# Patient Record
Sex: Female | Born: 1945
Health system: Southern US, Community
[De-identification: ages and names within clinical notes are randomized; demographics above are authoritative.]

## PROBLEM LIST (undated history)

## (undated) DIAGNOSIS — R7309 Other abnormal glucose: Secondary | ICD-10-CM

## (undated) DIAGNOSIS — M8580 Other specified disorders of bone density and structure, unspecified site: Secondary | ICD-10-CM

## (undated) DIAGNOSIS — R011 Cardiac murmur, unspecified: Secondary | ICD-10-CM

## (undated) DIAGNOSIS — E039 Hypothyroidism, unspecified: Secondary | ICD-10-CM

## (undated) DIAGNOSIS — E785 Hyperlipidemia, unspecified: Secondary | ICD-10-CM

## (undated) DIAGNOSIS — K219 Gastro-esophageal reflux disease without esophagitis: Secondary | ICD-10-CM

## (undated) DIAGNOSIS — F419 Anxiety disorder, unspecified: Secondary | ICD-10-CM

## (undated) HISTORY — DX: Hypothyroidism, unspecified: E03.9

## (undated) HISTORY — DX: Other specified disorders of bone density and structure, unspecified site: M85.80

## (undated) HISTORY — DX: Cardiac murmur, unspecified: R01.1

## (undated) HISTORY — DX: Anxiety disorder, unspecified: F41.9

## (undated) HISTORY — PX: OTHER SURGICAL HISTORY: SHX169

## (undated) HISTORY — DX: Gastro-esophageal reflux disease without esophagitis: K21.9

## (undated) HISTORY — DX: Other abnormal glucose: R73.09

## (undated) HISTORY — DX: Hyperlipidemia, unspecified: E78.5

---
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
## 1985-01-23 HISTORY — PX: DILATION AND CURETTAGE OF UTERUS: SHX78

## 1995-01-24 HISTORY — PX: EXTERNAL FIXATION ANKLE FRACTURE: SHX1548

## 1998-02-18 ENCOUNTER — Other Ambulatory Visit: Admission: RE | Admit: 1998-02-18 | Discharge: 1998-02-18 | Payer: Self-pay | Admitting: Obstetrics and Gynecology

## 1999-04-11 ENCOUNTER — Other Ambulatory Visit: Admission: RE | Admit: 1999-04-11 | Discharge: 1999-04-11 | Payer: Self-pay | Admitting: Obstetrics and Gynecology

## 2000-04-11 ENCOUNTER — Other Ambulatory Visit: Admission: RE | Admit: 2000-04-11 | Discharge: 2000-04-11 | Payer: Self-pay | Admitting: Obstetrics and Gynecology

## 2001-05-14 ENCOUNTER — Other Ambulatory Visit: Admission: RE | Admit: 2001-05-14 | Discharge: 2001-05-14 | Payer: Self-pay | Admitting: Obstetrics and Gynecology

## 2001-08-23 ENCOUNTER — Ambulatory Visit (HOSPITAL_COMMUNITY): Admission: RE | Admit: 2001-08-23 | Discharge: 2001-08-23 | Payer: Self-pay | Admitting: Internal Medicine

## 2001-08-23 ENCOUNTER — Encounter: Payer: Self-pay | Admitting: Internal Medicine

## 2002-06-03 ENCOUNTER — Other Ambulatory Visit: Admission: RE | Admit: 2002-06-03 | Discharge: 2002-06-03 | Payer: Self-pay | Admitting: Obstetrics and Gynecology

## 2002-11-20 ENCOUNTER — Encounter: Payer: Self-pay | Admitting: Internal Medicine

## 2004-02-04 ENCOUNTER — Ambulatory Visit: Payer: Self-pay | Admitting: Internal Medicine

## 2004-02-15 ENCOUNTER — Ambulatory Visit: Payer: Self-pay | Admitting: Internal Medicine

## 2004-07-05 ENCOUNTER — Other Ambulatory Visit: Admission: RE | Admit: 2004-07-05 | Discharge: 2004-07-05 | Payer: Self-pay | Admitting: Obstetrics and Gynecology

## 2005-01-12 ENCOUNTER — Ambulatory Visit: Payer: Self-pay | Admitting: Internal Medicine

## 2005-12-22 ENCOUNTER — Ambulatory Visit: Payer: Self-pay | Admitting: Internal Medicine

## 2006-03-08 ENCOUNTER — Ambulatory Visit: Payer: Self-pay | Admitting: Internal Medicine

## 2006-03-08 LAB — CONVERTED CEMR LAB
ALT: 16 units/L (ref 0–40)
AST: 19 units/L (ref 0–37)
Cholesterol: 257 mg/dL (ref 0–200)
Direct LDL: 170.7 mg/dL
HDL: 47.6 mg/dL (ref >39.0)
Hgb A1c MFr Bld: 5.6 % (ref 4.6–6.0)
TSH: 3.89 microintl units/mL (ref 0.35–5.50)
Total CHOL/HDL Ratio: 5.4
Triglycerides: 156 mg/dL — ABNORMAL HIGH (ref 0–149)
VLDL: 31 mg/dL (ref 0–40)

## 2006-07-31 ENCOUNTER — Ambulatory Visit: Payer: Self-pay | Admitting: Internal Medicine

## 2006-07-31 DIAGNOSIS — R739 Hyperglycemia, unspecified: Secondary | ICD-10-CM

## 2006-07-31 DIAGNOSIS — E039 Hypothyroidism, unspecified: Secondary | ICD-10-CM

## 2006-07-31 LAB — CONVERTED CEMR LAB
Cholesterol, target level: 200 mg/dL
HDL goal, serum: 40 mg/dL
LDL Goal: 160 mg/dL

## 2006-08-07 ENCOUNTER — Encounter (INDEPENDENT_AMBULATORY_CARE_PROVIDER_SITE_OTHER): Payer: Self-pay | Admitting: *Deleted

## 2006-08-07 ENCOUNTER — Encounter: Payer: Self-pay | Admitting: Internal Medicine

## 2006-08-07 LAB — CONVERTED CEMR LAB
ALT: 17 units/L (ref 0–35)
AST: 20 units/L (ref 0–37)
Cholesterol: 296 mg/dL (ref 0–200)
Direct LDL: 190.6 mg/dL
HDL: 46.1 mg/dL (ref >39.0)
Hgb A1c MFr Bld: 5.7 % (ref 4.6–6.0)
TSH: 3.51 microintl units/mL (ref 0.35–5.50)
Total CHOL/HDL Ratio: 6.4
Triglycerides: 213 mg/dL (ref 0–149)
VLDL: 43 mg/dL — ABNORMAL HIGH (ref 0–40)

## 2006-09-03 ENCOUNTER — Encounter: Payer: Self-pay | Admitting: Internal Medicine

## 2006-11-09 ENCOUNTER — Ambulatory Visit: Payer: Self-pay | Admitting: Internal Medicine

## 2006-11-18 LAB — CONVERTED CEMR LAB
ALT: 19 units/L (ref 0–35)
AST: 20 units/L (ref 0–37)
Cholesterol: 193 mg/dL (ref 0–200)
HDL: 53.7 mg/dL (ref >39.0)
LDL Cholesterol: 119 mg/dL — ABNORMAL HIGH (ref 0–99)
Total CHOL/HDL Ratio: 3.6
Triglycerides: 104 mg/dL (ref 0–149)
VLDL: 21 mg/dL (ref 0–40)

## 2006-11-20 ENCOUNTER — Encounter (INDEPENDENT_AMBULATORY_CARE_PROVIDER_SITE_OTHER): Payer: Self-pay | Admitting: *Deleted

## 2006-11-20 ENCOUNTER — Telehealth (INDEPENDENT_AMBULATORY_CARE_PROVIDER_SITE_OTHER): Payer: Self-pay | Admitting: *Deleted

## 2007-03-14 ENCOUNTER — Telehealth: Payer: Self-pay | Admitting: Internal Medicine

## 2007-03-27 ENCOUNTER — Ambulatory Visit: Payer: Self-pay | Admitting: Internal Medicine

## 2007-03-31 LAB — CONVERTED CEMR LAB
ALT: 22 units/L (ref 0–35)
AST: 22 units/L (ref 0–37)
Albumin: 3.7 g/dL (ref 3.5–5.2)
Alkaline Phosphatase: 55 units/L (ref 39–117)
BUN: 10 mg/dL (ref 6–23)
Bilirubin, Direct: 0.1 mg/dL (ref 0.0–0.3)
Cholesterol: 208 mg/dL (ref 0–200)
Creatinine, Ser: 0.9 mg/dL (ref 0.4–1.2)
Direct LDL: 129.1 mg/dL
HDL: 48.1 mg/dL (ref >39.0)
Potassium: 4.1 meq/L (ref 3.5–5.1)
TSH: 5.26 microintl units/mL (ref 0.35–5.50)
Total Bilirubin: 0.7 mg/dL (ref 0.3–1.2)
Total CHOL/HDL Ratio: 4.3
Total Protein: 6.6 g/dL (ref 6.0–8.3)
Triglycerides: 149 mg/dL (ref 0–149)
VLDL: 30 mg/dL (ref 0–40)

## 2007-04-01 ENCOUNTER — Encounter (INDEPENDENT_AMBULATORY_CARE_PROVIDER_SITE_OTHER): Payer: Self-pay | Admitting: *Deleted

## 2007-04-05 ENCOUNTER — Telehealth (INDEPENDENT_AMBULATORY_CARE_PROVIDER_SITE_OTHER): Payer: Self-pay | Admitting: *Deleted

## 2007-08-02 ENCOUNTER — Ambulatory Visit: Payer: Self-pay | Admitting: Internal Medicine

## 2007-08-02 DIAGNOSIS — M8580 Other specified disorders of bone density and structure, unspecified site: Secondary | ICD-10-CM

## 2007-08-02 DIAGNOSIS — S82899A Other fracture of unspecified lower leg, initial encounter for closed fracture: Secondary | ICD-10-CM | POA: Insufficient documentation

## 2007-08-02 DIAGNOSIS — Z9089 Acquired absence of other organs: Secondary | ICD-10-CM | POA: Insufficient documentation

## 2007-08-02 DIAGNOSIS — Z9889 Other specified postprocedural states: Secondary | ICD-10-CM

## 2007-08-02 DIAGNOSIS — E785 Hyperlipidemia, unspecified: Secondary | ICD-10-CM

## 2007-08-13 ENCOUNTER — Encounter (INDEPENDENT_AMBULATORY_CARE_PROVIDER_SITE_OTHER): Payer: Self-pay | Admitting: *Deleted

## 2007-08-13 ENCOUNTER — Telehealth (INDEPENDENT_AMBULATORY_CARE_PROVIDER_SITE_OTHER): Payer: Self-pay | Admitting: *Deleted

## 2008-03-04 ENCOUNTER — Telehealth (INDEPENDENT_AMBULATORY_CARE_PROVIDER_SITE_OTHER): Payer: Self-pay | Admitting: *Deleted

## 2008-07-31 ENCOUNTER — Ambulatory Visit: Payer: Self-pay | Admitting: Internal Medicine

## 2008-07-31 LAB — CONVERTED CEMR LAB
ALT: 16 units/L (ref 0–35)
AST: 20 units/L (ref 0–37)
Albumin: 3.8 g/dL (ref 3.5–5.2)
Alkaline Phosphatase: 77 units/L (ref 39–117)
BUN: 12 mg/dL (ref 6–23)
Basophils Absolute: 0 10*3/uL (ref 0.0–0.1)
Basophils Relative: 0.2 % (ref 0.0–3.0)
Bilirubin, Direct: 0.1 mg/dL (ref 0.0–0.3)
CO2: 32 meq/L (ref 19–32)
Calcium: 9.1 mg/dL (ref 8.4–10.5)
Chloride: 102 meq/L (ref 96–112)
Cholesterol: 285 mg/dL — ABNORMAL HIGH (ref 0–200)
Creatinine, Ser: 0.9 mg/dL (ref 0.4–1.2)
Direct LDL: 202.1 mg/dL
Eosinophils Absolute: 0.2 10*3/uL (ref 0.0–0.7)
Eosinophils Relative: 4.3 % (ref 0.0–5.0)
GFR calc non Af Amer: 67.12 mL/min (ref >60)
Glucose, Bld: 87 mg/dL (ref 70–99)
HCT: 39.6 % (ref 36.0–46.0)
HDL: 44.2 mg/dL (ref >39.00)
Hemoglobin: 13.7 g/dL (ref 12.0–15.0)
Hgb A1c MFr Bld: 5.7 % (ref 4.6–6.5)
Lymphocytes Relative: 29.2 % (ref 12.0–46.0)
Lymphs Abs: 1.1 10*3/uL (ref 0.7–4.0)
MCHC: 34.5 g/dL (ref 30.0–36.0)
MCV: 90.6 fL (ref 78.0–100.0)
Monocytes Absolute: 0.3 10*3/uL (ref 0.1–1.0)
Monocytes Relative: 7.9 % (ref 3.0–12.0)
Neutro Abs: 2.1 10*3/uL (ref 1.4–7.7)
Neutrophils Relative %: 58.4 % (ref 43.0–77.0)
Platelets: 214 10*3/uL (ref 150.0–400.0)
Potassium: 4 meq/L (ref 3.5–5.1)
RBC: 4.37 M/uL (ref 3.87–5.11)
RDW: 12.8 % (ref 11.5–14.6)
Sodium: 141 meq/L (ref 135–145)
TSH: 4.19 microintl units/mL (ref 0.35–5.50)
Total Bilirubin: 1.1 mg/dL (ref 0.3–1.2)
Total CHOL/HDL Ratio: 6
Total Protein: 7 g/dL (ref 6.0–8.3)
Triglycerides: 151 mg/dL — ABNORMAL HIGH (ref 0.0–149.0)
VLDL: 30.2 mg/dL (ref 0.0–40.0)
WBC: 3.7 10*3/uL — ABNORMAL LOW (ref 4.5–10.5)

## 2008-08-05 ENCOUNTER — Ambulatory Visit: Payer: Self-pay | Admitting: Internal Medicine

## 2008-10-28 ENCOUNTER — Ambulatory Visit: Payer: Self-pay | Admitting: Internal Medicine

## 2008-10-29 ENCOUNTER — Encounter (INDEPENDENT_AMBULATORY_CARE_PROVIDER_SITE_OTHER): Payer: Self-pay | Admitting: *Deleted

## 2008-10-29 LAB — CONVERTED CEMR LAB
Albumin: 3.7 g/dL (ref 3.5–5.2)
Alkaline Phosphatase: 66 units/L (ref 39–117)
Cholesterol: 162 mg/dL (ref 0–200)
HDL: 42.5 mg/dL (ref >39.00)
VLDL: 23.8 mg/dL (ref 0.0–40.0)

## 2008-11-16 ENCOUNTER — Ambulatory Visit: Payer: Self-pay | Admitting: Internal Medicine

## 2009-01-19 ENCOUNTER — Telehealth (INDEPENDENT_AMBULATORY_CARE_PROVIDER_SITE_OTHER): Payer: Self-pay | Admitting: *Deleted

## 2009-01-27 ENCOUNTER — Telehealth: Payer: Self-pay | Admitting: Internal Medicine

## 2009-03-05 ENCOUNTER — Ambulatory Visit: Payer: Self-pay | Admitting: Internal Medicine

## 2009-03-05 DIAGNOSIS — R51 Headache: Secondary | ICD-10-CM

## 2009-03-05 DIAGNOSIS — R519 Headache, unspecified: Secondary | ICD-10-CM | POA: Insufficient documentation

## 2009-10-27 ENCOUNTER — Ambulatory Visit: Payer: Self-pay | Admitting: Internal Medicine

## 2009-10-27 ENCOUNTER — Encounter: Payer: Self-pay | Admitting: Internal Medicine

## 2009-10-27 DIAGNOSIS — I471 Supraventricular tachycardia: Secondary | ICD-10-CM

## 2009-10-28 LAB — CONVERTED CEMR LAB
AST: 21 units/L (ref 0–37)
Albumin: 3.8 g/dL (ref 3.5–5.2)
Alkaline Phosphatase: 71 units/L (ref 39–117)
BUN: 12 mg/dL (ref 6–23)
Basophils Absolute: 0 10*3/uL (ref 0.0–0.1)
Bilirubin, Direct: 0.1 mg/dL (ref 0.0–0.3)
CO2: 31 meq/L (ref 19–32)
Calcium: 9.1 mg/dL (ref 8.4–10.5)
Cholesterol: 195 mg/dL (ref 0–200)
Creatinine, Ser: 0.7 mg/dL (ref 0.4–1.2)
Eosinophils Absolute: 0.2 10*3/uL (ref 0.0–0.7)
GFR calc non Af Amer: 86.5 mL/min (ref >60)
Glucose, Bld: 89 mg/dL (ref 70–99)
LDL Cholesterol: 127 mg/dL — ABNORMAL HIGH (ref 0–99)
Lymphocytes Relative: 27.3 % (ref 12.0–46.0)
MCHC: 33.8 g/dL (ref 30.0–36.0)
Monocytes Relative: 7.6 % (ref 3.0–12.0)
Neutro Abs: 2.6 10*3/uL (ref 1.4–7.7)
Neutrophils Relative %: 60.2 % (ref 43.0–77.0)
Platelets: 213 10*3/uL (ref 150.0–400.0)
RDW: 13.7 % (ref 11.5–14.6)
Total Bilirubin: 0.7 mg/dL (ref 0.3–1.2)
Total CHOL/HDL Ratio: 4

## 2010-01-25 ENCOUNTER — Ambulatory Visit
Admission: RE | Admit: 2010-01-25 | Discharge: 2010-01-25 | Payer: Self-pay | Source: Home / Self Care | Attending: Internal Medicine | Admitting: Internal Medicine

## 2010-01-25 DIAGNOSIS — J019 Acute sinusitis, unspecified: Secondary | ICD-10-CM | POA: Insufficient documentation

## 2010-02-09 ENCOUNTER — Telehealth: Payer: Self-pay | Admitting: Internal Medicine

## 2010-02-20 LAB — CONVERTED CEMR LAB
ALT: 24 units/L (ref 0–35)
AST: 24 units/L (ref 0–37)
Albumin: 3.9 g/dL (ref 3.5–5.2)
Alkaline Phosphatase: 64 units/L (ref 39–117)
BUN: 9 mg/dL (ref 6–23)
Basophils Absolute: 0 10*3/uL (ref 0.0–0.1)
Basophils Relative: 0.3 % (ref 0.0–1.0)
Bilirubin, Direct: 0.1 mg/dL (ref 0.0–0.3)
CO2: 32 meq/L (ref 19–32)
Calcium: 9.2 mg/dL (ref 8.4–10.5)
Chloride: 103 meq/L (ref 96–112)
Cholesterol, target level: 200 mg/dL
Cholesterol: 200 mg/dL (ref 0–200)
Creatinine, Ser: 0.8 mg/dL (ref 0.4–1.2)
Eosinophils Absolute: 0.1 10*3/uL (ref 0.0–0.7)
Eosinophils Relative: 3.4 % (ref 0.0–5.0)
GFR calc Af Amer: 93 mL/min
GFR calc non Af Amer: 77 mL/min
Glucose, Bld: 88 mg/dL (ref 70–99)
HCT: 40.4 % (ref 36.0–46.0)
HDL goal, serum: 50 mg/dL
HDL: 43.8 mg/dL (ref >39.0)
Hemoglobin: 13.6 g/dL (ref 12.0–15.0)
Hgb A1c MFr Bld: 5.9 % (ref 4.6–6.0)
LDL Cholesterol: 131 mg/dL — ABNORMAL HIGH (ref 0–99)
LDL Goal: 100 mg/dL
Lymphocytes Relative: 28.6 % (ref 12.0–46.0)
MCHC: 33.6 g/dL (ref 30.0–36.0)
MCV: 91.9 fL (ref 78.0–100.0)
Monocytes Absolute: 0.3 10*3/uL (ref 0.1–1.0)
Monocytes Relative: 7.2 % (ref 3.0–12.0)
Neutro Abs: 2.5 10*3/uL (ref 1.4–7.7)
Neutrophils Relative %: 60.5 % (ref 43.0–77.0)
Platelets: 217 10*3/uL (ref 150–400)
Potassium: 3.9 meq/L (ref 3.5–5.1)
RBC: 4.39 M/uL (ref 3.87–5.11)
RDW: 13 % (ref 11.5–14.6)
Sodium: 140 meq/L (ref 135–145)
TSH: 2.39 microintl units/mL (ref 0.35–5.50)
Total Bilirubin: 0.7 mg/dL (ref 0.3–1.2)
Total CHOL/HDL Ratio: 4.6
Total Protein: 6.7 g/dL (ref 6.0–8.3)
Triglycerides: 126 mg/dL (ref 0–149)
VLDL: 25 mg/dL (ref 0–40)
Vit D, 1,25-Dihydroxy: 55 (ref 30–89)
WBC: 4.1 10*3/uL — ABNORMAL LOW (ref 4.5–10.5)

## 2010-02-22 NOTE — Assessment & Plan Note (Signed)
Summary: CPX//KN   Vital Signs:  Patient profile:   65 year old female Height:      65 inches Weight:      136 pounds BMI:     22.71 Temp:     98.4 degrees F oral Pulse rate:   76 / minute Resp:     14 per minute BP sitting:   112 / 68  (left arm) Cuff size:   large  Vitals Entered By: Shonna Chock CMA (October 27, 2009 8:20 AM)  CC: Lipid Management Comments Patient refused flu vaccine, patient states " The sickest I've every been was after I received the flu Vaccine in the past."   CC:  Lipid Management.  History of Present Illness: Amy Tucker is here for a physical.The EKG  revealed 3 beat  run of   SVT ( PACs vs PNCs)  ; she is asymptomatic & denies excess stimulant intake. Hyperlipidemia Follow-Up       The patient denies muscle aches, GI upset, abdominal pain, flushing, itching, constipation, diarrhea, and fatigue.  The patient denies the following symptoms: chest pain/pressure, exercise intolerance, dypsnea, palpitations ( see EKG) , syncope, and pedal edema.  Compliance with medications (by patient report) has been near 100%.  Dietary compliance has been good.  FH of CAD; 1 M uncle pre 20.  Lipid Management History:      Positive NCEP/ATP III risk factors include female age 51 years old or older, early menopause without estrogen hormone replacement, and family history for ischemic heart disease (males less than 75 years old).  Negative NCEP/ATP III risk factors include non-diabetic, non-tobacco-user status, non-hypertensive, no ASHD (atherosclerotic heart disease), no prior stroke/TIA, no peripheral vascular disease, and no history of aortic aneurysm.     Current Medications (verified): 1)  Synthroid 75 Mcg  Tabs (Levothyroxine Sodium) .[name].. 1 By Mouth Once Daily Except 1& 1/2 On Weds 2)  Alprazolam 0.25 Mg  Tabs (Alprazolam) .... 1/2 Tab As Needed For Storms 3)  Vit C 4)  Vit E 5)  Multivitamin 6)  Calcium 7)  Vit D 8)  Simvastatin 40 Mg  Tabs (Simvastatin) .[name].. 1 At  Bedtime ; Start This After Present Supply of Pravastatin Completed; Fasting Labs 10 Weeks Later 9)  Maxalt-Mlt 10 Mg Tbdp (Rizatriptan Benzoate) .[name].. 1 As Needed Headache  Allergies: 1)  ! Meloxicam (Meloxicam) 2)  ! Tramadol Hcl (Tramadol Hcl)  Past History:  Past Medical History: OSTEOPENIA (ICD-733.90), BMD as per  Dr  Richarda Overlie, Gyn HYPERLIPIDEMIA (ICD-272.4):NMR Lipoprofile 2004: LDL 169( 2322/1419), TG 130. LDL goal = < 100. Framingham Study LDL goal = < 130. HYPOTHYROIDISM (ICD-244.9) FASTING HYPERGLYCEMIA 790.29  Past Surgical History: FRACTURE, ANKLE (ICD-824.8) post fall 1997 DILATION AND CURETTAGE, PMH  OF (ICD-V45.89) X3 TONSILLECTOMY AND ADENOIDECTOMY, HX OF (ICD-V45.79) G 1 P1 ; Colonoscopy: none to date ("I don't want to be put to sleep") SOC reviewed.  Family History: Family History Thyroid disease-Aunt MI-4 Uncles , 1 pre 50 y.o. DM-M-Aunts & M uncles Father: leukemia Mother: DM,HTN,CAD,MI after 68 ,CVA,RA, renal failure Siblings: bro: HTN  Social History: Low fat,carb Occupation:Auditor  Former Smoker: quit 1992. She smoked from ages  59 to 75 up to 2 ppd Alcohol use-yes: rarely Regular exercise-no  Review of Systems  The patient denies anorexia, fever, vision loss, decreased hearing, hoarseness, prolonged cough, headaches, hemoptysis, melena, hematochezia, severe indigestion/heartburn, hematuria, suspicious skin lesions, depression, unusual weight change, abnormal bleeding, enlarged lymph nodes, and angioedema.  Weight down 3#. No dysphagia; occasional burning SS with certain foods such as [place] foods.  Physical Exam  General:  well-nourished; alert,appropriate and cooperative throughout examination Head:  Normocephalic and atraumatic without obvious abnormalities.  Eyes:  No corneal or conjunctival inflammation noted.  Perrla. Funduscopic exam benign, without hemorrhages, exudates or papilledema. Ears:  External ear exam shows no  significant lesions or deformities.  Otoscopic examination reveals clear canals, tympanic membranes are intact bilaterally without bulging, retraction, inflammation or discharge. Hearing is grossly normal bilaterally. Nose:  External nasal examination shows no deformity or inflammation. Nasal mucosa are pink and moist without lesions or exudates. Mouth:  Oral mucosa and oropharynx without lesions or exudates.  Upper partial Neck:  No deformities, masses, or tenderness noted. Lungs:  Normal respiratory effort, chest expands symmetrically. Lungs are clear to auscultation, no crackles or wheezes. Heart:  Normal rate and regular rhythm. S1 and S2 normal without gallop, click, rub .S4 . Grade 1/2 LSB murmur Abdomen:  Bowel sounds positive,abdomen soft and non-tender without masses, organomegaly or hernias noted. Rectal:  Stool cards done @ Gyn Genitalia:  Dr Marcelle Overlie Msk:  No deformity or scoliosis noted of thoracic or lumbar spine.   Pulses:  R and L carotid,radial,dorsalis pedis and posterior tibial pulses are full and equal bilaterally Extremities:  No clubbing, cyanosis, edema. Minor OA DIP deformities  noted with normal full range of motion of all joints.   Neurologic:  alert & oriented X3 and DTRs symmetrical and normal.   Skin:  Intact without suspicious lesions or rashes Cervical Nodes:  No lymphadenopathy noted Axillary Nodes:  No palpable lymphadenopathy Psych:  memory intact for recent and remote, normally interactive, and good eye contact.     Impression & Recommendations:  Problem # 1:  ROUTINE GENERAL MEDICAL EXAM@[email or web address]  CARE FACL (ICD-V70.0)  Orders: EKG w/ Interpretation (93000) Venipuncture (43329) TLB-Lipid Panel (80061-LIPID) TLB-BMP (Basic Metabolic Panel-BMET) (80048-METABOL) TLB-CBC Platelet - w/Differential (85025-CBCD) TLB-Hepatic/Liver Function Pnl (80076-HEPATIC) TLB-TSH (Thyroid Stimulating Hormone) (84443-TSH) T-Vitamin D (25-Hydroxy) (51884-16606)  Problem # 2:   HYPERLIPIDEMIA (ICD-272.4)  Her updated medication list for this problem includes:    Simvastatin 40 Mg Tabs (Simvastatin) .[name]... 1 at bedtime ; start this after present supply of pravastatin completed; fasting labs 10 weeks later  Problem # 3:  HYPOTHYROIDISM (ICD-244.9)  Her updated medication list for this problem includes:    Synthroid 75 Mcg Tabs (Levothyroxine sodium) .[name]... 1 by mouth once daily except 1& 1/2 on weds ( note : she is taking 1 once daily )  Problem # 4:  OSTEOPENIA (ICD-733.90)  Problem # 5:  PAROXYSMAL SUPRAVENTRICULAR TACHYCARDIA (ICD-427.0) 3 beat, asymptomatic  Complete Medication List: 1)  Synthroid 75 Mcg Tabs (Levothyroxine sodium) .[name].. 1 by mouth once daily except 1& 1/2 on weds ( note : she is taking 1 once daily ) 2)  Alprazolam 0.25 Mg Tabs (Alprazolam) .... 1/2 tab as needed for storms 3)  Vit C  4)  Vit E  5)  Multivitamin  6)  Calcium  7)  Vit D  8)  Simvastatin 40 Mg Tabs (Simvastatin) .[name].. 1 at bedtime ; start this after present supply of pravastatin completed; fasting labs 10 weeks later  Lipid Assessment/Plan:      Based on NCEP/ATP III, the patient's risk factor category is "2 or more risk factors and a calculated 10 year CAD risk of < 20%".  The patient's lipid goals are as follows: Total cholesterol goal is 200; LDL cholesterol goal is 100; HDL  cholesterol goal is 50; Triglyceride goal is 150.  Her LDL cholesterol goal has been met.  Secondary causes for hyperlipidemia have been ruled out.  She has been counseled on adjunctive measures for lowering her cholesterol and has been provided with dietary instructions.    Patient Instructions: 1)  Avoid excess stimulants  as discussed; report palpitations. 2)  It is important that you exercise regularly at least 20 minutes 5 times a week. If you develop chest pain, have severe difficulty breathing, or feel very tired , stop exercising immediately and seek medical attention. 3)  Take an  81 mg coaqted  Aspirin every day with b'fast. 4)  Take 650-1000mg  of Tylenol every 4-6 hours as needed for relief of pain or comfort of fever AVOID taking more than 4000mg   in a 24 hour period (can cause liver damage in higher doses). Prescriptions: SIMVASTATIN 40 MG  TABS (SIMVASTATIN) 1 at bedtime ; start this after present supply of pravastatin completed; fasting labs 10 weeks later  #90 x 3   Entered and Authorized by:   Marga Melnick MD   Signed by:   Marga Melnick MD on 10/27/2009   Method used:   Print then Give to Patient   RxID:   [id number] ALPRAZOLAM 0.25 MG  TABS (ALPRAZOLAM) 1/2 tab as needed for storms  #60 x 3   Entered and Authorized by:   Marga Melnick MD   Signed by:   Marga Melnick MD on 10/27/2009   Method used:   Print then Give to Patient   RxID:   [id number] SYNTHROID 75 MCG  TABS (LEVOTHYROXINE SODIUM) 1 by mouth once daily EXCEPT 1& 1/2 on Weds ( Note : she is taking 1 once daily )  #90 x 3   Entered and Authorized by:   Marga Melnick MD   Signed by:   Marga Melnick MD on 10/27/2009   Method used:   Print then Give to Patient   RxID:   [id number]     Appended Document: CPX//KN

## 2010-02-22 NOTE — Assessment & Plan Note (Signed)
Summary: Medication Concerns (Mobic not helping)/scm   Vital Signs:  Patient profile:   65 year old female Weight:      143 pounds Temp:     98.3 degrees F oral Pulse rate:   80 / minute Resp:     15 per minute BP sitting:   124 / 78  (left arm) Cuff size:   large  Vitals Entered By: Shonna Chock (March 05, 2009 3:00 PM) CC: Medication concerns, Revisit increasing Synthroid, Headaches Comments REVIEWED MED LIST, PATIENT AGREED DOSE AND INSTRUCTION CORRECT    CC:  Medication concerns, Revisit increasing Synthroid, and Headaches.  History of Present Illness: Replacements for Darvocet ineffective ; she was dizzy with tramadol & Meloxicam "hurt stomach". No rash or fever  with these drugs. The headaches have not varied for > 20-25 yrs.The patient reports photophobia, but denies nausea, vomiting, sweats, tearing of eyes, nasal congestion, sinus pain, sinus pressure, and phonophobia.  The headache is described as intermittent and throbbing.  The location of the pain is usually unilateral on the left OR  unilateral on the right OR rarely in midline  The patient denies the following high-risk features: fever, neck pain/stiffness, vision loss or change, focal weakness, altered mental status, rash, trauma, pain worse with exertion, and new type of headache.  The headaches are precipitated by odors (antique stores & smoke) and change in weather.  No diagnosis of migraines. No prodrome or aura.  Allergies (verified): 1)  ! Meloxicam (Meloxicam) 2)  ! Tramadol Hcl (Tramadol Hcl)  Review of Systems General:  Complains of fatigue; TSH was 4.19 in 07/2008. ENT:  Complains of decreased hearing; denies ringing in ears. Neuro:  Denies brief paralysis, numbness, tingling, and weakness. Endo:  Complains of cold intolerance.  Physical Exam  General:  Appearsyounger than age; alert,appropriate and cooperative throughout examination Eyes:  No corneal or conjunctival inflammation noted. EOMI. Perrla.  Field of Vision grossly normal. Ears:  External ear exam shows no significant lesions or deformities.  Otoscopic examination reveals clear canals, tympanic membranes are intact bilaterally without bulging, retraction, inflammation or discharge. Hearing is grossly normal bilaterally. Mouth:  Oral mucosa and oropharynx without lesions or exudates.  Upper partial Heart:  Normal rate and regular rhythm. S1 and S2 normal without gallop, murmur, click, rub or other extra sounds. Pulses:  R and L carotid pulses are full and equal bilaterally w/o bruits Neurologic:  alert & oriented X3, cranial nerves II-XII intact, strength normal in all extremities, sensation intact to light touch, gait normal, DTRs symmetrical and normal, finger-to-nose normal, and Romberg negative.   Skin:  Intact without suspicious lesions or rashes Psych:  memory intact for recent and remote, normally interactive, and good eye contact.     Impression & Recommendations:  Problem # 1:  HEADACHE (ICD-784.0) Previously responsive to Darvocet; intolerance to Tramadol & Meloxicam The following medications were removed from the medication list:    Tramadol Hcl 50 Mg Tabs (Tramadol hcl) .[name]... 1 by mouth every 6 hours as needed    Meloxicam 7.5 Mg Tabs (Meloxicam) .[name]... 1 two times a day as needed pain Her updated medication list for this problem includes:    Maxalt-mlt 10 Mg Tbdp (Rizatriptan benzoate) .[name]... 1 as needed headache  Problem # 2:  HYPOTHYROIDISM (ICD-244.9)  Her updated medication list for this problem includes:    Synthroid 75 Mcg Tabs (Levothyroxine sodium) .[name]... 1 by mouth once daily except 1& 1/2 on weds  Complete Medication List: 1)  Synthroid 75  Mcg Tabs (Levothyroxine sodium) .[name].. 1 by mouth once daily except 1& 1/2 on weds 2)  Alprazolam 0.25 Mg Tabs (Alprazolam) .... 1/2 tab as needed for storms 3)  Vit C  4)  Vit E  5)  Multivitamin  6)  Calcium  7)  Vit D  8)  Simvastatin 40 Mg Tabs (Simvastatin) .[name].. 1  at bedtime ; start this after present supply of pravastatin completed; fasting labs 10 weeks later 9)  Maxalt-mlt 10 Mg Tbdp (Rizatriptan benzoate) .[name].. 1 as needed headache  Patient Instructions: 1)  Incerase Synthroid to 1 once daily except 1&1/2 every Weds; TSH after 4 months.(244.9). Keep Headache Diary as discussed Prescriptions: MAXALT-MLT 10 MG TBDP (RIZATRIPTAN BENZOATE) 1 as needed headache  #6 x 5   Entered and Authorized by:   Marga Melnick MD   Signed by:   Marga Melnick MD on 03/05/2009   Method used:   Print then Give to Patient   RxID:   [id number]

## 2010-02-22 NOTE — Progress Notes (Signed)
Summary: Tramadol Not Working  Phone Note Call from Patient Call back at [phone]   Caller: Patient Summary of Call: Patient would like Tramadol switched to something else, med makes her nauseated and it not helping headaches  Dr.Martita Brumm please further advise and forward to Triage Nurse  [facility]  [facility]  January 27, 2009 11:32 AM   Follow-up for Phone Call        see Mobic Rx Follow-up by: Marga Melnick MD,  January 27, 2009 4:18 PM  Additional Follow-up for Phone Call Additional follow up Details #1::        pt aware................Marland KitchenFelecia Deloach CMA  January 27, 2009 4:52 PM     New/Updated Medications: MELOXICAM 7.5 MG TABS (MELOXICAM) 1 two times a day as needed pain Prescriptions: MELOXICAM 7.5 MG TABS (MELOXICAM) 1 two times a day as needed pain  #20 x 0   Entered and Authorized by:   Marga Melnick MD   Signed by:   Jeremy Johann CMA on 01/27/2009   Method used:   Faxed to ...       [facility]       [address]       [place], [place]  [address]       Ph: [phone]       Fax: [phone]   RxID:   [id number]

## 2010-02-24 NOTE — Progress Notes (Signed)
Summary: Cough/RX  Phone Note Call from Patient Call back at Work Phone [phone]   Summary of Call: Patient called noting that her cough went away but has returned and she still has some drainage. All other symptoms have resolved.  She would like to know if she needs another round of ABX. Please advise. Initial call taken by: Lucious Groves CMA,  February 09, 2010 12:10 PM  Follow-up for Phone Call        see Rx Follow-up by: Marga Melnick MD,  February 09, 2010 12:56 PM  Additional Follow-up for Phone Call Additional follow up Details #1::        Patient notified. Additional Follow-up by: Lucious Groves CMA,  February 09, 2010 2:15 PM    New/Updated Medications: AZITHROMYCIN 250 MG TABS (AZITHROMYCIN) as per pack Prescriptions: AZITHROMYCIN 250 MG TABS (AZITHROMYCIN) as per pack  #1 x 0   Entered and Authorized by:   Marga Melnick MD   Signed by:   Marga Melnick MD on 02/09/2010   Method used:   Electronically to        [facility] (retail)       [address]       [place], [place]  [address]       Ph: [phone]       Fax: [phone]   RxID:   [id number]

## 2010-02-24 NOTE — Assessment & Plan Note (Signed)
Summary: POSSIBLE URI/KB   Vital Signs:  Patient profile:   65 year old female Weight:      136.6 pounds BMI:     22.81 Temp:     98.2 degrees F oral Pulse rate:   72 / minute Resp:     15 per minute BP sitting:   122 / 80  (left arm) Cuff size:   large  Vitals Entered By: Shonna Chock CMA (January 25, 2010 4:24 PM) CC: URI symptoms since last Wed   CC:  URI symptoms since last Wed.  History of Present Illness:      This is a 65 year old woman who presents with  RTI symptoms; onset 1227 as rhinitis  followed by fever (resolved 12/31 ) , N&V and diarrhea (resolved 12/29).  The patient reports  alternating nasal congestion,scant  purulent nasal discharge, productive cough, and   L earache.  The patient  now denies fever, dyspnea, wheezing, vomiting, and diarrhea.  The patient also reports L frontal  headache.  Risk factors for Strep sinusitis include L  unilateral facial pain.  The patient denies the following risk factors for Strep sinusitis: tooth pain and tender adenopathy.  Rx: vit C, Echinacea , Robitussin, Airborne, & Tylenol.  Current Medications (verified): 1)  Synthroid 75 Mcg  Tabs (Levothyroxine Sodium) .[name].. 1 By Mouth Once Daily Except 1& 1/2 On Weds ( Note : She Is Taking 1 Once Daily ) 2)  Alprazolam 0.25 Mg  Tabs (Alprazolam) .... 1/2 Tab As Needed For Storms 3)  Vit C 4)  Vit E 5)  Multivitamin 6)  Calcium 7)  Vit D 8)  Simvastatin 40 Mg  Tabs (Simvastatin) .[name].. 1 At Bedtime ; Start This After Present Supply of Pravastatin Completed; Fasting Labs 10 Weeks Later  Allergies: 1)  ! Meloxicam (Meloxicam) 2)  ! Tramadol Hcl (Tramadol Hcl)  Physical Exam  General:  in no acute distress; alert,appropriate and cooperative throughout examination Ears:  External ear exam shows no significant lesions or deformities.  Otoscopic examination reveals clear canals, tympanic membranes are intact bilaterally without bulging, retraction, inflammation or discharge. Hearing is  grossly normal bilaterally. Nose:  External nasal examination shows no deformity or inflammation. Nasal mucosa are  dry without lesions or exudates. Mouth:  Oral mucosa and oropharynx without lesions or exudates.  Upper partial Lungs:  Normal respiratory effort, chest expands symmetrically. Lungs are clear to auscultation, no crackles or wheezes. Heart:  Normal rate and regular rhythm. S1 and S2 normal without gallop, murmur, click, rub or other extra sounds. S4 Cervical Nodes:  No lymphadenopathy noted Axillary Nodes:  No palpable lymphadenopathy   Impression & Recommendations:  Problem # 1:  SINUSITIS- ACUTE-NOS (ICD-461.9)  Her updated medication list for this problem includes:    Amoxicillin 500 Mg Caps (Amoxicillin) .[name]... 1 three times a day    Benzonatate 200 Mg Caps (Benzonatate) .[name]... 1 every 6-8 hrs as needed for cough  Problem # 2:  BRONCHITIS-ACUTE (ICD-466.0)  Her updated medication list for this problem includes:    Amoxicillin 500 Mg Caps (Amoxicillin) .[name]... 1 three times a day    Benzonatate 200 Mg Caps (Benzonatate) .[name]... 1 every 6-8 hrs as needed for cough  Complete Medication List: 1)  Synthroid 75 Mcg Tabs (Levothyroxine sodium) .[name].. 1 by mouth once daily except 1& 1/2 on weds ( note : she is taking 1 once daily ) 2)  Alprazolam 0.25 Mg Tabs (Alprazolam) .... 1/2 tab as needed for storms  3)  Vit C  4)  Vit E  5)  Multivitamin  6)  Calcium  7)  Vit D  8)  Simvastatin 40 Mg Tabs (Simvastatin) .[name].. 1 at bedtime ; start this after present supply of pravastatin completed; fasting labs 10 weeks later 9)  Amoxicillin 500 Mg Caps (Amoxicillin) .[name].. 1 three times a day 10)  Benzonatate 200 Mg Caps (Benzonatate) .[name].. 1 every 6-8 hrs as needed for cough  Patient Instructions: 1)  Neti pot once daily - two times a day as needed for head congestion. 2)  Drink as much NON dairy  fluid as you can tolerate for the next few days. Prescriptions: BENZONATATE 200 MG CAPS  (BENZONATATE) 1 every 6-8 hrs as needed for cough  #15 x 0   Entered and Authorized by:   Marga Melnick MD   Signed by:   Marga Melnick MD on 01/25/2010   Method used:   Electronically to        [facility] [address] (retail)       [phone]       [place], [place]  [address]       Ph: [phone]       Fax: [phone]   RxID:   [id number] AMOXICILLIN 500 MG CAPS (AMOXICILLIN) 1 three times a day  #30 x 0   Entered and Authorized by:   Marga Melnick MD   Signed by:   Marga Melnick MD on 01/25/2010   Method used:   Electronically to        [facility] [address] (retail)       [phone]       [place], [place]  [address]       Ph: [phone]       Fax: [phone]   RxID:   [id number]    Orders Added: 1)  Est. Patient Level III [83151]

## 2010-03-09 ENCOUNTER — Telehealth: Payer: Self-pay | Admitting: Internal Medicine

## 2010-03-16 NOTE — Progress Notes (Signed)
Summary: med question/congestion  Phone Note Call from Patient Call back at Work Phone [phone]   Summary of Call: Patient left message on triage asking if it is ok for her to take Tylenol Severe Cold Multisympton. Please advise. Initial call taken by: Lucious Groves CMA,  March 09, 2010 9:16 AM  Follow-up for Phone Call        not if it contains Pseudoephedrine (decongestant) Follow-up by: Marga Melnick MD,  March 09, 2010 12:53 PM  Additional Follow-up for Phone Call Additional follow up Details #1::        Patient notified of the above and would like to know what she can take. She notes that her previous symptoms have returned and appear to be worse on  one side. Please advise. Lucious Groves CMA  March 09, 2010 2:20 PM      Additional Follow-up for Phone Call Additional follow up Details #2::    she neds OV; she continues to have symptoms despite 2 courses of antibiotics(she may need CXray & PFTs). can she come in this afternoon? Follow-up by: Marga Melnick MD,  March 09, 2010 3:27 PM  Additional Follow-up for Phone Call Additional follow up Details #3:: Details for Additional Follow-up Action Taken: Patient notes that she does not have cough or fever and she thinks that this is a cold. Patient declined office visit stating that she would "tough it out". She does think she needs any of the above. Lucious Groves CMA  March 09, 2010 3:41 PM

## 2010-06-08 ENCOUNTER — Encounter: Payer: Self-pay | Admitting: Internal Medicine

## 2010-06-08 ENCOUNTER — Ambulatory Visit (INDEPENDENT_AMBULATORY_CARE_PROVIDER_SITE_OTHER): Payer: Medicare PPO | Admitting: Internal Medicine

## 2010-06-08 VITALS — BP 114/68 | HR 64 | Temp 99.1°F | Wt 138.2 lb

## 2010-06-08 DIAGNOSIS — R21 Rash and other nonspecific skin eruption: Secondary | ICD-10-CM

## 2010-06-08 DIAGNOSIS — L509 Urticaria, unspecified: Secondary | ICD-10-CM

## 2010-06-08 MED ORDER — HYDROXYZINE PAMOATE 25 MG PO CAPS: 25.0000 mg | ORAL_CAPSULE | Freq: Three times a day (TID) | ORAL | Status: DC | PRN

## 2010-06-08 MED ORDER — RANITIDINE HCL 150 MG PO TABS: 150.0000 mg | ORAL_TABLET | Freq: Two times a day (BID) | ORAL | Status: DC

## 2010-06-08 MED ORDER — PREDNISONE 20 MG PO TABS: 20.0000 mg | ORAL_TABLET | Freq: Two times a day (BID) | ORAL | Status: AC

## 2010-06-08 NOTE — Patient Instructions (Signed)
Go to [facility] MD for Urticaria or hives

## 2010-06-08 NOTE — Progress Notes (Signed)
  Subjective:    Patient ID: Amy Tucker, female    DOB: [birth year], 65 y.o.   MRN: [id number]  HPI RASH  Location: both arms ; LUE > RUE Onset: 1 month ago upon awakening 04/16  Course: slightly improved but persistent Self-treated with: cortisone cream & topical area             Improvement with treatment: partially in reference to pruritis  History Pruritis: yes  Tenderness: no  New medications/antibiotics: no  Tick/insect/pet exposure: no, but tick found on dog night prior to rash  Recent travel: no  New detergent, new clothing, or other topical exposure: no   Red Flags Feeling ill: no  Fever: no  Mouth lesions: yes; 2 months ago Facial/tongue swelling/difficulty breathing:  no  Diabetic or immunocompromised:  no      Review of Systems     Objective:   Physical Exam Oral exam: Dental hygiene is good; lips and gums are healthy appearing.There is no oropharyngeal erythema or exudate noted. Skin:  Classic vascular lesions of various size LUE ; all blanch with pressure.Dermatographia  demonstrable  Lymphatic: No lymphadenopathy is noted about the head, neck, axilla, or inguinal areas.         Assessment & Plan:  #1 urticarial vascular type lesions which blanch with pressure  Plan: Both H2 and H1 blockers will be employed along with a short course of prednisone. She'll be referred to [facility] M.D. concerning urticaria.

## 2010-07-28 ENCOUNTER — Other Ambulatory Visit: Payer: Self-pay | Admitting: Internal Medicine

## 2010-10-19 ENCOUNTER — Encounter: Payer: Self-pay | Admitting: Internal Medicine

## 2010-10-20 ENCOUNTER — Ambulatory Visit (INDEPENDENT_AMBULATORY_CARE_PROVIDER_SITE_OTHER): Payer: Medicare PPO | Admitting: Internal Medicine

## 2010-10-20 ENCOUNTER — Encounter: Payer: Self-pay | Admitting: Internal Medicine

## 2010-10-20 DIAGNOSIS — F419 Anxiety disorder, unspecified: Secondary | ICD-10-CM

## 2010-10-20 DIAGNOSIS — R7989 Other specified abnormal findings of blood chemistry: Secondary | ICD-10-CM

## 2010-10-20 DIAGNOSIS — E785 Hyperlipidemia, unspecified: Secondary | ICD-10-CM

## 2010-10-20 DIAGNOSIS — E039 Hypothyroidism, unspecified: Secondary | ICD-10-CM

## 2010-10-20 DIAGNOSIS — R51 Headache: Secondary | ICD-10-CM

## 2010-10-20 DIAGNOSIS — Z0000 Encounter for general adult medical examination without abnormal findings: Secondary | ICD-10-CM

## 2010-10-20 DIAGNOSIS — M949 Disorder of cartilage, unspecified: Secondary | ICD-10-CM

## 2010-10-20 DIAGNOSIS — M899 Disorder of bone, unspecified: Secondary | ICD-10-CM

## 2010-10-20 LAB — CBC WITH DIFFERENTIAL/PLATELET
Basophils Relative: 0.7 % (ref 0.0–3.0)
Eosinophils Absolute: 0.2 10*3/uL (ref 0.0–0.7)
Eosinophils Relative: 4.1 % (ref 0.0–5.0)
Lymphocytes Relative: 21.8 % (ref 12.0–46.0)
MCHC: 32.4 g/dL (ref 30.0–36.0)
Neutrophils Relative %: 67.1 % (ref 43.0–77.0)
RBC: 4.44 Mil/uL (ref 3.87–5.11)
WBC: 4.9 10*3/uL (ref 4.5–10.5)

## 2010-10-20 MED ORDER — SIMVASTATIN 40 MG PO TABS: 40.0000 mg | ORAL_TABLET | Freq: Every day | ORAL | Status: DC

## 2010-10-20 MED ORDER — LEVOTHYROXINE SODIUM 75 MCG PO TABS: 75.0000 ug | ORAL_TABLET | ORAL | Status: DC

## 2010-10-20 MED ORDER — ALPRAZOLAM 0.25 MG PO TABS: ORAL_TABLET | ORAL | Status: DC

## 2010-10-20 NOTE — Progress Notes (Signed)
Subjective:    Patient ID: Amy Tucker, female    DOB: August 25, 1945, 65 y.o.   MRN: [id number]  HPI Medicare Wellness Visit:  The following psychosocial & medical history were reviewed as required by Medicare.   Social history: caffeine: 3/4 cup coffee , alcohol:  rarely ,  tobacco use : quit 1992  & exercise : walking 7X/ week 30-60 min.   Home & personal  safety / fall risk: no issues, activities of daily living: no limitations , seatbelt use : yes , and smoke alarm employment : yes .  Power of Attorney/Living Will status : NO (discussed)  Vision ( as recorded per Nurse) & Hearing  evaluation :  Last Ophth exam 2010; wall chart read @ 6 ft with lenses. Decreased acuity to whisper @ 6 ft. Orientation :oriented  X3 , memory & recall :good, spelling or math testing: good ,and mood & affect : normal . Depression / anxiety: no Travel history : never , immunization status :NO ( risks discussed) , transfusion history:  no, and preventive health surveillance ( colonoscopies, BMD , etc as per protocol/ SOC): colonoscopy :"NEVER";"I don't want to be put to sleep" [facility] discussed), Dental care:  Every 6 months . Chart reviewed &  Updated. Active issues reviewed & addressed.       Review of Systems HYPERLIPIDEMIA: Chest pain, palpitations- gas ? Vs  Palpitations ?, non exertional       Dyspnea- no Medications: Compliance- yes  Lightheadedness,Syncope- no    Edema- no Abd pain, bowel changes- no   Muscle aches- no but some arthralgias  FASTING HYPERGLYCEMIA: Disease Monitoring: Blood Sugar ranges-no Polyuria/phagia/dipsia- no       Visual problems- no( Ophth exam overdue) Medications: Compliance- no meds Diet:low fat, low carb  FH: 9 M uncles & aunts & MGM had DM                Objective:   Physical Exam Gen.: Healthy and well-nourished in appearance. Alert, appropriate and cooperative throughout exam.Appears younger than stated age Head: Normocephalic without obvious  abnormalities Eyes: No corneal or conjunctival inflammation noted. Pupils equal round reactive to light and accommodation. Fundal exam is benign without hemorrhages, exudate, papilledema. Extraocular motion intact.Ptosis OD > OS Ears: External  ear exam reveals no significant lesions or deformities. Canals clear .TMs normal.  Nose: External nasal exam reveals no deformity or inflammation. Nasal mucosa are pink and moist. No lesions or exudates noted. Mouth: Oral mucosa and oropharynx reveal no lesions or exudates. Teeth in good repair. Upper partial. Osteoma of palate Neck: No deformities, masses, or tenderness noted. Range of motion &. Thyroid  Full w/o nodules. Lungs: Normal respiratory effort; chest expands symmetrically. Lungs are clear to auscultation without rales, wheezes, or increased work of breathing. Heart: Normal rate and rhythm. Normal S1 and S2. No gallop, click, or rub. Soft S4 w/o  murmur. Abdomen: Bowel sounds normal; abdomen soft and nontender. No masses, organomegaly or hernias noted. Genitalia: Gyn needed   .                                                                                   Musculoskeletal/extremities: No  deformity or scoliosis noted of  the thoracic or lumbar spine; but  Slight asymmetry of paraspinus muscles. No clubbing, cyanosis, edema noted. Range of motion  Normal; SLR to > 90 degrees .Tone & strength  Normal.Joints:DIP OA changes. Nail health  good. Vascular: Carotid, radial artery, dorsalis pedis and  posterior tibial pulses are full and equal. No bruits present. Neurologic: Alert and oriented x3. Deep tendon reflexes symmetrical and normal.          Skin: Intact without suspicious lesions or rashes. Lymph: No cervical, axillary, or inguinal lymphadenopathy present. Psych: Mood and affect are normal. Normally interactive                                                                                        Assessment & Plan:  #1 Medicare Wellness  Exam; criteria met ; data entered #2 Problem List reviewed ; Assessment/ Recommendations made Plan: see Orders

## 2010-10-20 NOTE — Patient Instructions (Signed)
Preventive Health Care: Exercise  30-45  minutes a day, 3-4 days a week. Walking is especially valuable in preventing Osteoporosis. Eat a low-fat diet with lots of fruits and vegetables, up to 7-9 servings per day.Consume less than 30 grams of sugar per day from foods & drinks with High Fructose Corn Syrup as # 1,2,3 or #4 on label. Eye Doctor - have an eye exam @ least annually Health Care Power of Attorney & Living Will place you in charge of your health care  decisions. Verify these are  in place.  As per the Standard of Care , screening Colonoscopy recommended @ 50 & every 5-10 years thereafter . More frequent monitor would be dictated by family history or findings @ Colonoscopy

## 2010-10-21 LAB — HEPATIC FUNCTION PANEL
AST: 24 U/L (ref 0–37)
Albumin: 4.1 g/dL (ref 3.5–5.2)
Total Protein: 7 g/dL (ref 6.0–8.3)

## 2010-10-21 LAB — LIPID PANEL
Cholesterol: 209 mg/dL — ABNORMAL HIGH (ref 0–200)
HDL: 49.6 mg/dL (ref >39.00)
Triglycerides: 165 mg/dL — ABNORMAL HIGH (ref 0.0–149.0)

## 2010-10-21 LAB — LDL CHOLESTEROL, DIRECT: Direct LDL: 127.3 mg/dL

## 2010-10-21 LAB — TSH: TSH: 6.81 u[IU]/mL — ABNORMAL HIGH (ref 0.35–5.50)

## 2010-10-21 LAB — BASIC METABOLIC PANEL
Calcium: 8.6 mg/dL (ref 8.4–10.5)
Creatinine, Ser: 0.8 mg/dL (ref 0.4–1.2)

## 2010-10-21 LAB — VITAMIN D 25 HYDROXY (VIT D DEFICIENCY, FRACTURES): Vit D, 25-Hydroxy: 36 ng/mL (ref 30–89)

## 2010-10-27 ENCOUNTER — Telehealth: Payer: Self-pay

## 2010-10-27 MED ORDER — LEVOTHYROXINE SODIUM 75 MCG PO TABS: 75.0000 ug | ORAL_TABLET | ORAL | Status: DC

## 2010-10-27 NOTE — Telephone Encounter (Signed)
New rx sent to patient, med list updated. Labs mailed

## 2010-10-27 NOTE — Telephone Encounter (Signed)
Message copied by Edgardo Roys on Thu Oct 27, 2010 11:39 AM ------      Message from: Pecola Lawless      Created: Tue Oct 25, 2010  6:08 PM       The normal goal for  Vitamin D is 40-60. Vitamin D, along with calcium( 600 mg twice a day) & weight bearing exercises ( @ least 30 minutes of walking @ least 3X/ week),  is essential for bone health. Vitamin D is the # 1 cause of muscle pain in women. Add 1000 IU vitamin D3 one  Pill  3X/ week  to present daily dose. Recheck vit D level in 4-6  months (268.9).       The most common cause of elevated triglycerides  (TG)  is the ingestion of sugar from high fructose corn syrup sources. You should consume less than 30 grams  of sugar per day from foods and drinks with high fructose corn syrup as number 1, 2, 3, or #4 on the label. As TG go up, HDL or good cholesterol goes down. Also uric acid which causes gout will go up.      TSH (Thyroid Stimulating Hormone) normal range = 0.35- 5.50. Ideal value is 1-3. A  Value below 0.35 indicates excessive thyroid supplementation (HYPERthyroid state) & a Value > 5.50 indicates inadequate replacement.(HYPOthyroid state) Either extreme can have adverse long  term effects. Please increase thyroid supplement to 75 mcg daily EXCEPT 1 & 1/2 on Weds.Repeat TSH after 10  Weeks (244.9)        All other labs are excellent.[employer]

## 2011-01-25 ENCOUNTER — Other Ambulatory Visit: Payer: Self-pay | Admitting: Internal Medicine

## 2011-04-25 ENCOUNTER — Other Ambulatory Visit: Payer: Self-pay

## 2011-04-25 NOTE — Telephone Encounter (Signed)
Message left on triage voicemail: Patient states she is almost out of her medication and she called the pharmacy Last Wed and they stated they are waiting to hear from [place]. Please call back at [phone] or [phone]   I called patient back and left message informing her she is due for a TSH 244.9 level to be checked. We can schedule patient to have this done as early as today. Medication can be filled as soon as TSH level is back. Patient instructed to call back to discuss

## 2011-04-26 ENCOUNTER — Other Ambulatory Visit (INDEPENDENT_AMBULATORY_CARE_PROVIDER_SITE_OTHER): Payer: Medicare PPO

## 2011-04-26 DIAGNOSIS — E039 Hypothyroidism, unspecified: Secondary | ICD-10-CM

## 2011-04-26 LAB — TSH: TSH: 3.12 u[IU]/mL (ref 0.35–5.50)

## 2011-04-28 ENCOUNTER — Other Ambulatory Visit: Payer: Self-pay | Admitting: Internal Medicine

## 2011-04-28 MED ORDER — LEVOTHYROXINE SODIUM 75 MCG PO TABS: ORAL_TABLET | ORAL | Status: DC

## 2011-04-28 NOTE — Telephone Encounter (Signed)
Refill for  Synthroid Tab Qty 99 Take 1-tablet by mouth everyday EXCEPT on Wednesdays Take 1-1/2-tablets Last filled 1.4.13

## 2011-04-28 NOTE — Telephone Encounter (Signed)
RX sent

## 2011-05-02 NOTE — Telephone Encounter (Signed)
Patient had labs, rx was sent

## 2011-10-02 ENCOUNTER — Telehealth: Payer: Self-pay | Admitting: Internal Medicine

## 2011-10-02 DIAGNOSIS — R21 Rash and other nonspecific skin eruption: Secondary | ICD-10-CM

## 2011-10-02 NOTE — Telephone Encounter (Signed)
Refill: Ranitidine 150mg  tab. Take one tablet by mouth twice daily. Qty 60. Last fill 06-08-10 Prednisone 20mg  tab. Take one tablet by mouth twice daily. Qty 14. Last fill 06-08-10 Hydroxyz pam 25mg  cap. Take one capsule by mouth three times daily as needed for itching. Qty 30. Last fill 06-08-10

## 2011-10-03 MED ORDER — HYDROXYZINE PAMOATE 25 MG PO CAPS: 25.0000 mg | ORAL_CAPSULE | Freq: Three times a day (TID) | ORAL | Status: DC | PRN

## 2011-10-03 NOTE — Telephone Encounter (Signed)
I spoke with patient , patient stated that she was transferring from one [facility] to the other and did not need refills on Any medications. Patient stated she is no longer taking Ranitidine and did not need prednisone. This was a mix-up.

## 2011-10-09 ENCOUNTER — Other Ambulatory Visit: Payer: Self-pay | Admitting: Internal Medicine

## 2011-10-09 MED ORDER — RANITIDINE HCL 150 MG PO CAPS: 150.0000 mg | ORAL_CAPSULE | Freq: Two times a day (BID) | ORAL | Status: DC

## 2011-10-09 MED ORDER — PREDNISONE 10 MG PO TABS: 10.0000 mg | ORAL_TABLET | Freq: Every day | ORAL | Status: DC

## 2011-10-09 NOTE — Telephone Encounter (Signed)
Ranitidine 150 mg one twice a day dispense 60. Prednisone 10 (TEN , not twenty) mg twice a day with food, dispense 14. Would need office visit for additional refills.

## 2011-10-09 NOTE — Telephone Encounter (Signed)
Dr.Hopper please advise on patient's request for prednisone and ranitidine (not on med list)

## 2011-10-09 NOTE — Telephone Encounter (Signed)
RXs sent.

## 2011-10-09 NOTE — Telephone Encounter (Signed)
prednisone 20 mg Qty 14  Take one tablet by mouth twice daily Last filled 06/08/10   and ranitidine 150 mg  Qty 60 Take one tablet by mouth twice daily Last filled 06/08/10  Faxed number [phone]

## 2011-10-23 ENCOUNTER — Encounter: Payer: Self-pay | Admitting: Internal Medicine

## 2011-10-23 ENCOUNTER — Ambulatory Visit (INDEPENDENT_AMBULATORY_CARE_PROVIDER_SITE_OTHER): Payer: Medicare PPO | Admitting: Internal Medicine

## 2011-10-23 VITALS — BP 118/74 | HR 60 | Temp 97.8°F | Resp 12 | Ht 65.03 in | Wt 135.8 lb

## 2011-10-23 DIAGNOSIS — E039 Hypothyroidism, unspecified: Secondary | ICD-10-CM

## 2011-10-23 DIAGNOSIS — T887XXA Unspecified adverse effect of drug or medicament, initial encounter: Secondary | ICD-10-CM

## 2011-10-23 DIAGNOSIS — F419 Anxiety disorder, unspecified: Secondary | ICD-10-CM

## 2011-10-23 DIAGNOSIS — F411 Generalized anxiety disorder: Secondary | ICD-10-CM

## 2011-10-23 DIAGNOSIS — M899 Disorder of bone, unspecified: Secondary | ICD-10-CM

## 2011-10-23 DIAGNOSIS — Z0000 Encounter for general adult medical examination without abnormal findings: Secondary | ICD-10-CM

## 2011-10-23 DIAGNOSIS — R7989 Other specified abnormal findings of blood chemistry: Secondary | ICD-10-CM

## 2011-10-23 DIAGNOSIS — M949 Disorder of cartilage, unspecified: Secondary | ICD-10-CM

## 2011-10-23 DIAGNOSIS — E785 Hyperlipidemia, unspecified: Secondary | ICD-10-CM

## 2011-10-23 DIAGNOSIS — K219 Gastro-esophageal reflux disease without esophagitis: Secondary | ICD-10-CM

## 2011-10-23 LAB — BASIC METABOLIC PANEL
CO2: 28 mEq/L (ref 19–32)
Chloride: 102 mEq/L (ref 96–112)
Creatinine, Ser: 0.7 mg/dL (ref 0.4–1.2)
Glucose, Bld: 91 mg/dL (ref 70–99)

## 2011-10-23 LAB — HEPATIC FUNCTION PANEL
ALT: 16 U/L (ref 0–35)
Bilirubin, Direct: 0 mg/dL (ref 0.0–0.3)
Total Protein: 7.2 g/dL (ref 6.0–8.3)

## 2011-10-23 LAB — CBC WITH DIFFERENTIAL/PLATELET
Basophils Absolute: 0.1 10*3/uL (ref 0.0–0.1)
Eosinophils Absolute: 0.1 10*3/uL (ref 0.0–0.7)
Lymphocytes Relative: 20.8 % (ref 12.0–46.0)
MCHC: 32.5 g/dL (ref 30.0–36.0)
Monocytes Relative: 5.9 % (ref 3.0–12.0)
Neutrophils Relative %: 69.5 % (ref 43.0–77.0)
RDW: 13.7 % (ref 11.5–14.6)

## 2011-10-23 LAB — LDL CHOLESTEROL, DIRECT: Direct LDL: 209.7 mg/dL

## 2011-10-23 LAB — LIPID PANEL
Cholesterol: 304 mg/dL — ABNORMAL HIGH (ref 0–200)
Total CHOL/HDL Ratio: 7

## 2011-10-23 LAB — TSH: TSH: 4.47 u[IU]/mL (ref 0.35–5.50)

## 2011-10-23 MED ORDER — MELOXICAM 7.5 MG PO TABS: 7.5000 mg | ORAL_TABLET | Freq: Every day | ORAL | Status: DC

## 2011-10-23 MED ORDER — ALPRAZOLAM 0.25 MG PO TABS: ORAL_TABLET | ORAL | Status: DC

## 2011-10-23 NOTE — Progress Notes (Signed)
Subjective:    Patient ID: Amy Tucker, female    DOB: 08-10-1945, 66 y.o.   MRN: [id number]  HPI Medicare Wellness Visit:  The following psychosocial & medical history were reviewed as required by Medicare.   Social history: caffeine: 1.5 cups coffee/ day , alcohol:  Very rarely,  tobacco use : quit 1992  & exercise : walking 25 min > 5 X / week.   Home & personal  safety / fall risk: no issues, activities of daily living: no limitations , seatbelt use : yes , and smoke alarm employment : yes .  Power of Attorney/Living Will status :NO  Vision ( as recorded per Nurse) & Hearing  evaluation :  Ophth 2012; no hearing exam. Orientation :oriented X 3 , memory & recall :good,  math testing: good,and mood & affect : normal . Depression / anxiety: denied Travel history : never , immunization status : Shingles , PNA, Flu needed (all declined today) , transfusion history:  no, and preventive health surveillance ( colonoscopies, BMD , etc as per protocol/ SOC): NEVER had colonoscopy; "I don't want to be put to sleep" ( SOC reviewed), Dental care:  Every 12 mos . Chart reviewed &  Updated. Active issues reviewed & addressed.       Review of Systems HYPERLIPIDEMIA: Chest pain, palpitations- no       Dyspnea- no Lightheadedness,Syncope- no    Edema- no Medications: Compliance- statin D/Ced 1/13   FASTING HYPERGLYCEMIA, PMH of:  Disease Monitoring: Blood Sugar ranges-no Polyuria/phagia/dipsia- no      Visual problems- no   Abd pain, bowel changes- no   Muscle aches- minor off statin post activity      Objective:   Physical Exam Gen.:  well-nourished in appearance. Alert, appropriate and cooperative throughout exam. Head: Normocephalic without obvious abnormalities Eyes: No corneal or conjunctival inflammation noted.  Extraocular motion intact. Vision grossly normal with lenses. Ears: External  ear exam reveals no significant lesions or deformities. Canals clear .TMs normal. Hearing is  grossly normal bilaterally. Nose: External nasal exam reveals no deformity or inflammation. Nasal mucosa are pink and moist. No lesions or exudates noted.   Mouth: Oral mucosa and oropharynx reveal no lesions or exudates. Teeth in good repair.Osteoma of hard palate Neck: No deformities, masses, or tenderness noted. Range of motion & Thyroid normal. Lungs: Normal respiratory effort; chest expands symmetrically. Lungs are clear to auscultation without rales, wheezes, or increased work of breathing. Heart: Normal rate and rhythm. Normal S1 and S2. No gallop, click, or rub. S4 w/o murmur. Abdomen: Bowel sounds normal; abdomen soft and nontender. No masses, organomegaly or hernias noted. Genitalia: as per Gyn                                                                    Musculoskeletal/extremities: Minor lordosis noted of  the thoracic  spine. No clubbing, cyanosis, edema, or significant deformity noted. Range of motion  normal .Tone & strength  Normal.Joints:minor DIP DJD  changes. Nail health  good. Vascular: Carotid, radial artery, dorsalis pedis and  posterior tibial pulses are full and equal. No bruits present. Neurologic: Alert and oriented x3. Deep tendon reflexes symmetrical and normal.          Skin: Intact  without suspicious lesions or rashes. Lymph: No cervical, axillary lymphadenopathy present. Psych: Mood and affect are normal. Normally interactive                                                                                      Assessment & Plan:  #1 Medicare Wellness Exam; criteria met ; data entered #2 Problem List reviewed ; Assessment/ Recommendations made Plan: see Orders

## 2011-10-23 NOTE — Addendum Note (Signed)
Addended by: Silvio Pate D on: 10/23/2011 02:39 PM   Modules accepted: Orders

## 2011-10-23 NOTE — Patient Instructions (Addendum)
Preventive Health Care: Exercise  30-45  minutes a day, 3-4 days a week. Walking is especially valuable in preventing Osteoporosis. Eat a low-fat diet with lots of fruits and vegetables, up to 7-9 servings per day. Consume less than 30 grams of sugar per day from foods & drinks with High Fructose Corn Syrup as #1,2,3 or #4 on label. Please take enteric-coated aspirin 81 mg daily with breakfast.  Health Care Power of Attorney & Living Will place you in charge of your health care  decisions. Verify these are  in place. As per the Standard of Care , screening Colonoscopy recommended @ 50 & every 5-10 years thereafter . More frequent monitor would be dictated by family history or findings @ Colonoscopy. Please send complete cards  If you activate My Chart; the results can be released to you as soon as they populate from the lab. If you choose not to use this program; the labs have to be reviewed, copied & mailed   causing a delay in getting the results to you.

## 2011-10-27 LAB — VITAMIN D 1,25 DIHYDROXY
Vitamin D 1, 25 (OH)2 Total: 64 pg/mL (ref 18–72)
Vitamin D2 1, 25 (OH)2: <8 pg/mL
Vitamin D3 1, 25 (OH)2: 64 pg/mL

## 2011-10-31 ENCOUNTER — Encounter: Payer: Self-pay | Admitting: Internal Medicine

## 2011-10-31 ENCOUNTER — Other Ambulatory Visit: Payer: Self-pay

## 2011-10-31 MED ORDER — LEVOTHYROXINE SODIUM 75 MCG PO TABS: ORAL_TABLET | ORAL | Status: DC

## 2011-11-23 ENCOUNTER — Ambulatory Visit (INDEPENDENT_AMBULATORY_CARE_PROVIDER_SITE_OTHER): Payer: Medicare PPO | Admitting: Internal Medicine

## 2011-11-23 ENCOUNTER — Encounter: Payer: Self-pay | Admitting: Internal Medicine

## 2011-11-23 VITALS — BP 112/68 | HR 71 | Wt 136.6 lb

## 2011-11-23 DIAGNOSIS — E785 Hyperlipidemia, unspecified: Secondary | ICD-10-CM

## 2011-11-23 MED ORDER — PRAVASTATIN SODIUM 40 MG PO TABS: 40.0000 mg | ORAL_TABLET | Freq: Every day | ORAL | Status: DC

## 2011-11-23 NOTE — Assessment & Plan Note (Signed)
Pravastatin 40 mg @ bedtime ; fasting labs 10 weeks

## 2011-11-23 NOTE — Patient Instructions (Addendum)
Please  schedule fasting Labs in 10 weeks : CK,Lipids, hepatic panel.PLEASE BRING THESE INSTRUCTIONS TO FOLLOW UP  LAB APPOINTMENT.This will guarantee correct labs are drawn, eliminating need for repeat blood sampling ( needle sticks ! ). Diagnoses /Codes: 272.4,995.20. 

## 2011-11-23 NOTE — Progress Notes (Signed)
  Subjective:    Patient ID: Amy Tucker, female    DOB: 27-Oct-1945, 66 y.o.   MRN: [id number]  HPI Dyslipidemia assessment: Prior Advanced Lipid Testing: NMR LDL goal = < 100, ideally < 70.   Family history of premature CAD/ MI: extremely strong .  Nutrition: heart healthy .  Exercise: walking 20-30 min 5X/ week & yardwork . Diabetes : FBS 91 . HTN: no. Smoking history  : quit 1992 .   Weight :  stable.  Lab results reviewed : LDL 209.7     Review of Systems ROS: fatigue: no ; chest pain :no ;claudication: no but leg fatigue on statin; palpitations: no; abd pain/bowel changes: no ; myalgias:only on statin;  syncope : no ; memory loss: no;skin changes: no.     Objective:   Physical Exam She appears healthy and well-nourished; she is in no acute distress  No carotid bruits are present.  Heart rhythm and rate are normal with no significant murmurs or gallops.  Chest is clear with no increased work of breathing  There is no evidence of aortic aneurysm or renal artery bruits  She has no clubbing or edema.   Pedal pulses are intact   No ischemic skin changes are present         Assessment & Plan:

## 2012-02-01 ENCOUNTER — Other Ambulatory Visit (INDEPENDENT_AMBULATORY_CARE_PROVIDER_SITE_OTHER): Payer: Medicare PPO

## 2012-02-01 DIAGNOSIS — T887XXA Unspecified adverse effect of drug or medicament, initial encounter: Secondary | ICD-10-CM

## 2012-02-01 DIAGNOSIS — E785 Hyperlipidemia, unspecified: Secondary | ICD-10-CM

## 2012-02-01 LAB — LIPID PANEL
Cholesterol: 153 mg/dL (ref 0–200)
LDL Cholesterol: 70 mg/dL (ref 0–99)
VLDL: 20.4 mg/dL (ref 0.0–40.0)

## 2012-02-01 LAB — HEPATIC FUNCTION PANEL
Bilirubin, Direct: 0.1 mg/dL (ref 0.0–0.3)
Total Protein: 7 g/dL (ref 6.0–8.3)

## 2012-02-12 ENCOUNTER — Other Ambulatory Visit: Payer: Self-pay | Admitting: Internal Medicine

## 2012-02-14 ENCOUNTER — Encounter: Payer: Self-pay | Admitting: Internal Medicine

## 2012-05-28 ENCOUNTER — Other Ambulatory Visit: Payer: Self-pay | Admitting: Internal Medicine

## 2012-05-28 DIAGNOSIS — F419 Anxiety disorder, unspecified: Secondary | ICD-10-CM

## 2012-05-28 MED ORDER — ALPRAZOLAM 0.25 MG PO TABS: ORAL_TABLET | ORAL | Status: DC

## 2012-05-28 NOTE — Telephone Encounter (Signed)
Alprazolam refill request. Pt last seen on 11/23/11, med last filled on 10/23/11 #30 with 0 refills.   Per Pharmacy pravastatin has refills available.

## 2012-05-28 NOTE — Telephone Encounter (Signed)
Last seen 11/23/11 and filled 10/23/11 #30. Please advise     KP

## 2012-05-28 NOTE — Telephone Encounter (Signed)
#  90 pravastatin refill x2  Generic Xanax #30 refill x2

## 2012-05-29 ENCOUNTER — Other Ambulatory Visit: Payer: Self-pay | Admitting: Internal Medicine

## 2012-05-29 NOTE — Telephone Encounter (Signed)
Rx faxed on 05/29/12. JLT

## 2012-05-29 NOTE — Telephone Encounter (Signed)
Xanax rx faxed on 05/29/12.

## 2012-05-29 NOTE — Telephone Encounter (Signed)
Med filled on 05/29/12.

## 2012-07-02 ENCOUNTER — Ambulatory Visit (INDEPENDENT_AMBULATORY_CARE_PROVIDER_SITE_OTHER): Payer: Medicare PPO | Admitting: Internal Medicine

## 2012-07-02 ENCOUNTER — Encounter: Payer: Self-pay | Admitting: Internal Medicine

## 2012-07-02 VITALS — BP 122/80 | HR 83 | Temp 98.5°F | Wt 133.0 lb

## 2012-07-02 DIAGNOSIS — R202 Paresthesia of skin: Secondary | ICD-10-CM

## 2012-07-02 DIAGNOSIS — T887XXA Unspecified adverse effect of drug or medicament, initial encounter: Secondary | ICD-10-CM

## 2012-07-02 DIAGNOSIS — R195 Other fecal abnormalities: Secondary | ICD-10-CM

## 2012-07-02 DIAGNOSIS — R200 Anesthesia of skin: Secondary | ICD-10-CM

## 2012-07-02 DIAGNOSIS — R450 Nervousness: Secondary | ICD-10-CM

## 2012-07-02 DIAGNOSIS — F411 Generalized anxiety disorder: Secondary | ICD-10-CM

## 2012-07-02 DIAGNOSIS — R209 Unspecified disturbances of skin sensation: Secondary | ICD-10-CM

## 2012-07-02 NOTE — Patient Instructions (Addendum)
Reflux of gastric acid may be asymptomatic as this may occur mainly during sleep.The triggers for reflux  include stress; the "aspirin family" ; alcohol; peppermint; and caffeine (coffee, tea, cola, and chocolate). The aspirin family would include aspirin and the nonsteroidal agents such as ibuprofen &  Naproxen. Tylenol would not cause reflux. If having symptoms ; food & drink should be avoided for @ least 2 hours before going to bed. Hold Pravastatin, Zantac, and Pepto-Bismol. Use omeprazole 20 mg samples 30 minutes before breakfast and before the evening meal. Please complete and return stool cards; these will determine whether there is any gastrointestinal bleeding risk. Alprazolam every 8-12 hours as needed for anxiety

## 2012-07-02 NOTE — Progress Notes (Signed)
Subjective:    Patient ID: Amy Tucker, female    DOB: 08-29-1945, 67 y.o.   MRN: [id number]  HPI Last week  6/3 or 6/5 she had an episode of numbness and tingling in the left upper extremity from the shoulder to the hand which occurred at rest. It left a heavy sensation in the extremity.  On 6/6 she experienced nausea unrelated to initial symptoms; this has continued until today.  The numbness and tingling in the left upper extremity recurred 6/8 again at rest.  She states that she feels nervous as if she were having a panic attack. She also describes some tarry stool 6/8. She has been taking Pepto-Bismol and Zantac; the Pepto-Bismol did help the nausea when taken 6/7 & 6/9.  PMH of peptic ulcer      Review of Systems   She had similar  LUE symptoms 2 years ago which resolved when she stopped simvastatin.     Objective:   Physical Exam Gen.: Thin but healthy and well-nourished in appearance. Alert, appropriate and cooperative throughout exam. Head: Normocephalic without obvious abnormalities Eyes: No corneal or conjunctival inflammation noted. No icterus Nose: External nasal exam reveals no deformity or inflammation. Nasal mucosa are pink and moist. No lesions or exudates noted.   Mouth: Oral mucosa and oropharynx reveal no lesions or exudates. Teeth in good repair.Upper partial Neck: No deformities, masses, or tenderness noted. Range of motion normal. Lungs: Normal respiratory effort; chest expands symmetrically. Lungs are clear to auscultation without rales, wheezes, or increased work of breathing. Heart: Normal rate and rhythm. Normal S1 and S2. No gallop, click, or rub. S4 w/o murmur. Abdomen: Bowel sounds normal; abdomen soft and nontender. No masses, organomegaly or hernias noted.                                  Musculoskeletal/extremities: No deformity or scoliosis noted of  the thoracic or lumbar spine.  No clubbing, cyanosis, edema, or significant extremity  deformity  noted. Range of motion normal .Tone & strength  Normal. Joints reveal minor  DJD DIP changes. Nail health good. Able to lie down & sit up w/o help.  Vascular: Carotid, radial artery, dorsalis pedis and  posterior tibial pulses are full and equal. No bruits present. Neurologic: Alert and oriented x3. Deep tendon reflexes symmetrical and normal.         Skin: Intact without suspicious lesions or rashes.No tenting Lymph: No cervical, axillary lymphadenopathy present. Psych: Mood and affect are normal. Normally interactive                                                                                        Assessment & Plan:  #1 jitteriness  #2 nausea; probable reflux. Past medical history peptic ulcer  #3 dark stool in the context of taking Pepto-Bismol  #4 intermittent numbness and tingling and heavy sensation in the left upper extremity  Plan: See orders and recommendations  She broke down into tears  admitting that she is distraught because her aged dog has advanced medical problems is not likely to live.

## 2012-07-03 LAB — CBC WITH DIFFERENTIAL/PLATELET
Basophils Absolute: 0 10*3/uL (ref 0.0–0.1)
Basophils Relative: 0.3 % (ref 0.0–3.0)
Eosinophils Absolute: 0.1 10*3/uL (ref 0.0–0.7)
Lymphocytes Relative: 23.5 % (ref 12.0–46.0)
MCHC: 33.6 g/dL (ref 30.0–36.0)
MCV: 91.3 fl (ref 78.0–100.0)
Monocytes Absolute: 0.4 10*3/uL (ref 0.1–1.0)
Neutro Abs: 3.5 10*3/uL (ref 1.4–7.7)
Neutrophils Relative %: 66.9 % (ref 43.0–77.0)
RBC: 4.59 Mil/uL (ref 3.87–5.11)
RDW: 13.7 % (ref 11.5–14.6)

## 2012-07-03 LAB — T4, FREE: Free T4: 0.99 ng/dL (ref 0.60–1.60)

## 2012-07-03 LAB — ALT: ALT: 16 U/L (ref 0–35)

## 2012-07-03 LAB — BASIC METABOLIC PANEL
BUN: 9 mg/dL (ref 6–23)
Chloride: 104 mEq/L (ref 96–112)
Potassium: 3.9 mEq/L (ref 3.5–5.1)

## 2012-07-03 LAB — CK: Total CK: 51 U/L (ref 7–177)

## 2012-07-03 LAB — MAGNESIUM: Magnesium: 2.2 mg/dL (ref 1.5–2.5)

## 2012-07-03 LAB — TSH: TSH: 3.84 u[IU]/mL (ref 0.35–5.50)

## 2012-07-08 ENCOUNTER — Encounter: Payer: Self-pay | Admitting: Internal Medicine

## 2012-07-12 ENCOUNTER — Ambulatory Visit (HOSPITAL_BASED_OUTPATIENT_CLINIC_OR_DEPARTMENT_OTHER)
Admission: RE | Admit: 2012-07-12 | Discharge: 2012-07-12 | Disposition: A | Discharge status: Home / Self Care | Payer: Medicare PPO | Source: Ambulatory Visit | Attending: Family Medicine | Admitting: Family Medicine

## 2012-07-12 ENCOUNTER — Other Ambulatory Visit: Payer: Self-pay

## 2012-07-12 ENCOUNTER — Encounter: Payer: Self-pay | Admitting: Family Medicine

## 2012-07-12 ENCOUNTER — Ambulatory Visit (INDEPENDENT_AMBULATORY_CARE_PROVIDER_SITE_OTHER): Payer: Medicare PPO | Admitting: Family Medicine

## 2012-07-12 VITALS — BP 122/82 | HR 70 | Temp 98.1°F | Wt 133.0 lb

## 2012-07-12 DIAGNOSIS — N390 Urinary tract infection, site not specified: Secondary | ICD-10-CM

## 2012-07-12 DIAGNOSIS — R109 Unspecified abdominal pain: Secondary | ICD-10-CM

## 2012-07-12 DIAGNOSIS — M549 Dorsalgia, unspecified: Secondary | ICD-10-CM

## 2012-07-12 LAB — BASIC METABOLIC PANEL
BUN: 10 mg/dL (ref 6–23)
CO2: 29 mEq/L (ref 19–32)
Calcium: 9.4 mg/dL (ref 8.4–10.5)
Creatinine, Ser: 0.8 mg/dL (ref 0.4–1.2)
Glucose, Bld: 130 mg/dL — ABNORMAL HIGH (ref 70–99)
Sodium: 136 mEq/L (ref 135–145)

## 2012-07-12 LAB — CBC WITH DIFFERENTIAL/PLATELET
Basophils Relative: 0.3 % (ref 0.0–3.0)
Eosinophils Relative: 1.2 % (ref 0.0–5.0)
HCT: 43.7 % (ref 36.0–46.0)
Hemoglobin: 14.3 g/dL (ref 12.0–15.0)
Lymphs Abs: 1 10*3/uL (ref 0.7–4.0)
MCV: 92.7 fl (ref 78.0–100.0)
Monocytes Absolute: 0.4 10*3/uL (ref 0.1–1.0)
Monocytes Relative: 5.6 % (ref 3.0–12.0)
Neutro Abs: 5.1 10*3/uL (ref 1.4–7.7)
WBC: 6.6 10*3/uL (ref 4.5–10.5)

## 2012-07-12 LAB — HEPATIC FUNCTION PANEL
AST: 21 U/L (ref 0–37)
Bilirubin, Direct: 0 mg/dL (ref 0.0–0.3)
Total Bilirubin: 0.8 mg/dL (ref 0.3–1.2)

## 2012-07-12 LAB — POCT URINALYSIS DIPSTICK
Glucose, UA: NEGATIVE
Spec Grav, UA: <=1.005
pH, UA: 7.5

## 2012-07-12 LAB — AMYLASE: Amylase: 79 U/L (ref 27–131)

## 2012-07-12 MED ORDER — TRAMADOL HCL 50 MG PO TABS: 50.0000 mg | ORAL_TABLET | Freq: Four times a day (QID) | ORAL | Status: DC | PRN

## 2012-07-12 MED ORDER — GI COCKTAIL ~~LOC~~
30.0000 mL | Freq: Once | ORAL | Status: AC
  Administered 2012-07-12: 30 mL via ORAL

## 2012-07-12 MED ORDER — CIPROFLOXACIN HCL 500 MG PO TABS: 500.0000 mg | ORAL_TABLET | Freq: Two times a day (BID) | ORAL | Status: DC

## 2012-07-12 NOTE — Patient Instructions (Addendum)
Abdominal Pain Abdominal pain can be caused by many things. Your caregiver decides the seriousness of your pain by an examination and possibly blood tests and X-rays. Many cases can be observed and treated at home. Most abdominal pain is not caused by a disease and will probably improve without treatment. However, in many cases, more time must pass before a clear cause of the pain can be found. Before that point, it may not be known if you need more testing, or if hospitalization or surgery is needed. HOME CARE INSTRUCTIONS   Do not take laxatives unless directed by your caregiver.  Take pain medicine only as directed by your caregiver.  Only take over-the-counter or prescription medicines for pain, discomfort, or fever as directed by your caregiver.  Try a clear liquid diet (broth, tea, or water) for as long as directed by your caregiver. Slowly move to a bland diet as tolerated. SEEK IMMEDIATE MEDICAL CARE IF:   The pain does not go away.  You have a fever.  You keep throwing up (vomiting).  The pain is felt only in portions of the abdomen. Pain in the right side could possibly be appendicitis. In an adult, pain in the left lower portion of the abdomen could be colitis or diverticulitis.  You pass bloody or black tarry stools. MAKE SURE YOU:   Understand these instructions.  Will watch your condition.  Will get help right away if you are not doing well or get worse. Document Released: 10/19/2004 Document Revised: 04/03/2011 Document Reviewed: 08/28/2007 [facility] Patient Information 2014 [place], [place].  Follow up as needed

## 2012-07-12 NOTE — Progress Notes (Signed)
  Subjective:     Amy Tucker is a 67 y.o. female who presents for evaluation of abdominal pain. Onset was a few days ago. Symptoms have been gradually worsening. The pain is described as burning and cramping,.. Pain is located in the LUQ with radiation to left back.  Aggravating factors: eating.  Alleviating factors: not eating. Associated symptoms: nausea. The patient denies arthralagias, belching, chills, constipation, diarrhea, dysuria, fever, flatus, frequency, headache, hematochezia, hematuria, melena, myalgias, sweats and vomiting.  The patient's history has been marked as reviewed and updated as appropriate.  Review of Systems Pertinent items are noted in HPI.     Objective:    BP 122/82  Pulse 70  Temp(Src) 98.1 F (36.7 C) (Oral)  Wt 133 lb (60.328 kg)  BMI 22.11 kg/m2  SpO2 98% General appearance: alert, cooperative, appears stated age and no distress Throat: lips, mucosa, and tongue normal; teeth and gums normal Neck: no adenopathy, supple, symmetrical, trachea midline and thyroid not enlarged, symmetric, no tenderness/mass/nodules Lungs: clear to auscultation bilaterally Abdomen: abnormal findings:  moderate tenderness in the epigastrium  and LUQ----  Pt states better today because she has not eaten since 2pm yesterday   Assessment:    Abdominal pain .    Plan:    See orders for lab and imaging studies. Adhere to simple, bland diet. Further follow-up plans will be based on outcome of lab/imaging studies; see orders. con't PPI \ Check labs and [place] Some relief with GI cocktail

## 2012-07-14 LAB — URINE CULTURE

## 2012-07-16 ENCOUNTER — Telehealth: Payer: Self-pay | Admitting: *Deleted

## 2012-07-16 DIAGNOSIS — R109 Unspecified abdominal pain: Secondary | ICD-10-CM

## 2012-07-16 MED ORDER — HYDROCODONE-ACETAMINOPHEN 5-300 MG PO TABS: 1.0000 | ORAL_TABLET | Freq: Four times a day (QID) | ORAL | Status: DC | PRN

## 2012-07-16 NOTE — Telephone Encounter (Signed)
Pt state she unable to take the cipro and the tramadol due to it causing headache, nausea, dizziness and make her feel funny. Pt still c/o pain on left side under rib cage. Pt would like to know if another med can be Rx.Please advise  Pt uses [facility]

## 2012-07-16 NOTE — Telephone Encounter (Signed)
Patient has been made aware and agreed to the CT scan and the new Rx for pain medication. Orders are in and Rx has been faxed     KP

## 2012-07-16 NOTE — Telephone Encounter (Signed)
No uti-- no abx needed

## 2012-07-16 NOTE — Telephone Encounter (Signed)
vicodin 5/300 1 po 6 h prn #30   CT abd and pelvis no contrast

## 2012-07-18 ENCOUNTER — Ambulatory Visit (HOSPITAL_COMMUNITY): Payer: Medicare PPO

## 2012-07-19 ENCOUNTER — Ambulatory Visit (HOSPITAL_BASED_OUTPATIENT_CLINIC_OR_DEPARTMENT_OTHER)
Admission: RE | Admit: 2012-07-19 | Discharge: 2012-07-19 | Disposition: A | Discharge status: Home / Self Care | Payer: Medicare PPO | Source: Ambulatory Visit | Attending: Family Medicine | Admitting: Family Medicine

## 2012-07-19 DIAGNOSIS — Q762 Congenital spondylolisthesis: Secondary | ICD-10-CM | POA: Insufficient documentation

## 2012-07-19 DIAGNOSIS — R109 Unspecified abdominal pain: Secondary | ICD-10-CM | POA: Insufficient documentation

## 2012-07-19 DIAGNOSIS — Z87442 Personal history of urinary calculi: Secondary | ICD-10-CM | POA: Insufficient documentation

## 2012-07-19 DIAGNOSIS — I700 Atherosclerosis of aorta: Secondary | ICD-10-CM | POA: Insufficient documentation

## 2012-07-24 ENCOUNTER — Other Ambulatory Visit (INDEPENDENT_AMBULATORY_CARE_PROVIDER_SITE_OTHER): Payer: Medicare PPO

## 2012-07-24 DIAGNOSIS — Z0000 Encounter for general adult medical examination without abnormal findings: Secondary | ICD-10-CM

## 2012-07-24 DIAGNOSIS — Z1289 Encounter for screening for malignant neoplasm of other sites: Secondary | ICD-10-CM

## 2012-07-24 LAB — HEMOCCULT GUIAC POC 1CARD (OFFICE): Card #2 Fecal Occult Blod, POC: NEGATIVE

## 2012-08-26 ENCOUNTER — Other Ambulatory Visit: Payer: Self-pay

## 2012-08-26 DIAGNOSIS — Z1231 Encounter for screening mammogram for malignant neoplasm of breast: Secondary | ICD-10-CM

## 2012-08-28 ENCOUNTER — Other Ambulatory Visit: Payer: Self-pay

## 2012-09-12 ENCOUNTER — Ambulatory Visit
Admission: RE | Admit: 2012-09-12 | Discharge: 2012-09-12 | Disposition: A | Discharge status: Home / Self Care | Payer: Medicare PPO | Source: Ambulatory Visit

## 2012-09-12 DIAGNOSIS — Z1231 Encounter for screening mammogram for malignant neoplasm of breast: Secondary | ICD-10-CM

## 2012-10-01 ENCOUNTER — Other Ambulatory Visit: Payer: Self-pay | Admitting: Internal Medicine

## 2012-10-01 DIAGNOSIS — F419 Anxiety disorder, unspecified: Secondary | ICD-10-CM

## 2012-10-04 MED ORDER — ALPRAZOLAM 0.25 MG PO TABS: ORAL_TABLET | ORAL | Status: DC

## 2012-10-04 NOTE — Telephone Encounter (Signed)
RX printed and faxed to the pharmacy.//AB/CMA

## 2012-10-04 NOTE — Telephone Encounter (Signed)
Ok X1 

## 2012-10-04 NOTE — Telephone Encounter (Signed)
Last seen 07/02/12 and filled 05/28/12 #30. No UDS or contract on file. Please advise    KP

## 2012-10-23 ENCOUNTER — Encounter: Payer: Self-pay | Admitting: Lab

## 2012-10-23 ENCOUNTER — Telehealth: Payer: Self-pay

## 2012-10-23 NOTE — Telephone Encounter (Signed)
Meds reconciled, pharmacy and allergies verified.  

## 2012-10-23 NOTE — Telephone Encounter (Signed)
LM for CB  HM UTD WE flu, zostazax, PNA Hemocult neg 07/2012 No Colonoscopy on file Last EKG 06/2012

## 2012-10-24 ENCOUNTER — Encounter: Payer: Self-pay | Admitting: Internal Medicine

## 2012-10-24 ENCOUNTER — Ambulatory Visit (INDEPENDENT_AMBULATORY_CARE_PROVIDER_SITE_OTHER): Payer: Medicare PPO | Admitting: Internal Medicine

## 2012-10-24 VITALS — BP 108/65 | HR 64 | Temp 98.1°F | Ht 65.75 in | Wt 135.0 lb

## 2012-10-24 DIAGNOSIS — Z0000 Encounter for general adult medical examination without abnormal findings: Secondary | ICD-10-CM

## 2012-10-24 DIAGNOSIS — Z1331 Encounter for screening for depression: Secondary | ICD-10-CM

## 2012-10-24 DIAGNOSIS — M899 Disorder of bone, unspecified: Secondary | ICD-10-CM

## 2012-10-24 DIAGNOSIS — E039 Hypothyroidism, unspecified: Secondary | ICD-10-CM

## 2012-10-24 DIAGNOSIS — E785 Hyperlipidemia, unspecified: Secondary | ICD-10-CM

## 2012-10-24 DIAGNOSIS — R51 Headache: Secondary | ICD-10-CM

## 2012-10-24 LAB — BASIC METABOLIC PANEL
CO2: 32 mEq/L (ref 19–32)
Chloride: 101 mEq/L (ref 96–112)
Glucose, Bld: 90 mg/dL (ref 70–99)
Potassium: 3.7 mEq/L (ref 3.5–5.1)
Sodium: 136 mEq/L (ref 135–145)

## 2012-10-24 LAB — HEPATIC FUNCTION PANEL
ALT: 17 U/L (ref 0–35)
AST: 23 U/L (ref 0–37)
Albumin: 4.2 g/dL (ref 3.5–5.2)
Alkaline Phosphatase: 81 U/L (ref 39–117)
Bilirubin, Direct: 0 mg/dL (ref 0.0–0.3)
Total Protein: 7.2 g/dL (ref 6.0–8.3)

## 2012-10-24 LAB — CK: Total CK: 63 U/L (ref 7–177)

## 2012-10-24 LAB — LIPID PANEL: HDL: 50.2 mg/dL (ref >39.00)

## 2012-10-24 NOTE — Progress Notes (Signed)
Subjective:    Patient ID: Amy Tucker, female    DOB: Aug 05, 1945, 67 y.o.   MRN: [id number]  HPI Medicare Wellness Visit: Psychosocial and medical history were reviewed as required by Medicare (abuse, antisocial behavior risk, forearm risk). Social history: Caffeine: none , Alcohol: rarely , Tobacco use: quit 1992 Exercise: see below Personal safety/fall risk:no Limitations of activities of daily living:no Seatbelt smoke alarm use:yes Healthcare Power of Attorney/Living Will status: needed Ophthalmologic exam status:current Hearing evaluation status:not current Orientation: Oriented X3 Memory and recall: good Spelling  testing: good Depression/anxiety assessment: denied Foreign travel history:never Immunization status the shingles/bleeding/pneumonia/tetanus: no Flu or shingles Transfusion history:no Preventive health care maintenance status: Colonoscopy/BMD/mammogram/Pap as per protocol/standard care:none current; risks discussed Dental care:every 6 mos Chart reviewed and updated. Active issues reviewed and addressed.    Review of Systems She is on a heart healthy diet; she exercises as walking> 20 minutes 4 times per week without symptoms. Specifically she denies chest pain, palpitations, dyspnea, or claudication. Family history is STRONGLY positive for premature coronary disease in the maternal family.  Advanced cholesterol testing reveals her LDL goal is less than 100, ideally < 75. Statin taken every other day due to "muscle weakness".   She is not had a colonoscopy; standard of care reviewed. She denies abdominal pain, unexplained weight loss, melena, rectal bleeding, or change in stool caliber.FOB negative in June.  She does have history of osteopenia which has been followed by her gynecologist. She has not seen him for 4 years.    Objective:   Physical Exam Gen.: Thin but healthy and well-nourished in appearance. Alert, appropriate and cooperative throughout exam.Appears  younger than stated age  Head: Normocephalic without obvious abnormalities Eyes: No corneal or conjunctival inflammation noted. Pupils equal round reactive to light and accommodation.  Extraocular motion intact. Vision grossly normal with lenses. Unsustained nystagmus with lateral gaze Ears: External  ear exam reveals no significant lesions or deformities. Canals clear .TMs normal. Hearing is grossly decreased on L. Nose: External nasal exam reveals no deformity or inflammation. Nasal mucosa are pink and moist. No lesions or exudates noted.   Mouth: Oral mucosa and oropharynx reveal no lesions or exudates. Teeth in good repair. Upper partial Neck: No deformities, masses, or tenderness noted. Range of motion & Thyroid normal. Lungs: Normal respiratory effort; chest expands symmetrically. Lungs are clear to auscultation without rales, wheezes, or increased work of breathing. Heart: Normal rate and rhythm. Normal S1 and S2. No gallop, click, or rub. S4 w/o murmur. Distant heart sounds. Abdomen: Bowel sounds normal; abdomen soft and nontender. No masses, organomegaly or hernias noted. Genitalia: As per Gyn                                  Musculoskeletal/extremities: No deformity or scoliosis noted of  the thoracic or lumbar spine.  No clubbing, cyanosis, edema, or significant extremity  deformity noted. Range of motion normal .Tone & strength  Normal. Joints  reveal minor  DJD DIP changes. Nail health good. Able to lie down & sit up w/o help. Negative SLR bilaterally Vascular: Carotid, radial artery, dorsalis pedis and  posterior tibial pulses are full and equal. No bruits present. Neurologic: Alert and oriented x3. Deep tendon reflexes symmetrical and normal.         Skin: Intact without suspicious lesions or rashes. Lymph: No cervical, axillary lymphadenopathy present. Psych: Mood and affect are normal. Normally interactive  Assessment & Plan:  #1 Medicare Wellness Exam; criteria met ; data entered #2 Problem List/Diagnoses reviewed Plan:  Assessments made/ Orders entered  

## 2012-10-24 NOTE — Patient Instructions (Addendum)
As per the Standard of Care , screening Colonoscopy recommended @ 50 & every 5-10 years thereafter . More frequent monitor would be dictated by family history or findings @ Colonoscopy. Also please consider Fluzone & Shingles shots. Reflux of gastric acid may be asymptomatic as this may occur mainly during sleep.The triggers for reflux  include stress; the "aspirin family" ; alcohol; peppermint; and caffeine (coffee, tea, cola, and chocolate). The aspirin family would include aspirin and the nonsteroidal agents such as ibuprofen &  Naproxen. Tylenol would not cause reflux. If having symptoms ; food & drink should be avoided for @ least 2 hours before going to bed.

## 2012-10-25 LAB — LDL CHOLESTEROL, DIRECT: Direct LDL: 160.5 mg/dL

## 2012-10-25 LAB — TSH: TSH: 3.86 u[IU]/mL (ref 0.35–5.50)

## 2012-10-29 LAB — VITAMIN D 1,25 DIHYDROXY
Vitamin D 1, 25 (OH)2 Total: 65 pg/mL (ref 18–72)
Vitamin D2 1, 25 (OH)2: <8 pg/mL

## 2012-12-16 ENCOUNTER — Encounter: Payer: Self-pay | Admitting: Internal Medicine

## 2012-12-20 ENCOUNTER — Other Ambulatory Visit: Payer: Self-pay | Admitting: *Deleted

## 2012-12-20 ENCOUNTER — Encounter: Payer: Self-pay | Admitting: *Deleted

## 2012-12-20 DIAGNOSIS — F419 Anxiety disorder, unspecified: Secondary | ICD-10-CM

## 2012-12-20 MED ORDER — LEVOTHYROXINE SODIUM 75 MCG PO TABS: ORAL_TABLET | ORAL | Status: DC

## 2012-12-20 MED ORDER — ALPRAZOLAM 0.25 MG PO TABS: ORAL_TABLET | ORAL | Status: DC

## 2012-12-20 MED ORDER — TRAMADOL HCL 50 MG PO TABS: 50.0000 mg | ORAL_TABLET | Freq: Four times a day (QID) | ORAL | Status: DC | PRN

## 2012-12-20 NOTE — Telephone Encounter (Signed)
Synthroid sent.  Tramadol Last refill: 07/12/2012 Last OV: 10/24/2012 No contract on file

## 2012-12-25 ENCOUNTER — Encounter: Payer: Self-pay | Admitting: Internal Medicine

## 2013-05-02 ENCOUNTER — Encounter: Payer: Self-pay | Admitting: Internal Medicine

## 2013-05-05 ENCOUNTER — Other Ambulatory Visit: Payer: Self-pay | Admitting: Internal Medicine

## 2013-05-05 DIAGNOSIS — E785 Hyperlipidemia, unspecified: Secondary | ICD-10-CM

## 2013-05-05 DIAGNOSIS — F419 Anxiety disorder, unspecified: Secondary | ICD-10-CM

## 2013-05-05 MED ORDER — ALPRAZOLAM 0.25 MG PO TABS: ORAL_TABLET | ORAL | Status: DC

## 2013-05-05 MED ORDER — PRAVASTATIN SODIUM 40 MG PO TABS: ORAL_TABLET | ORAL | Status: DC

## 2013-05-05 NOTE — Telephone Encounter (Signed)
Pravastatin and Alprazolam sent to [facility] on [address]

## 2013-05-05 NOTE — Telephone Encounter (Signed)
Message copied by Shelly Coss on Mon May 05, 2013 10:58 AM ------      Message from: Hendricks Limes      Created: Mon May 05, 2013 10:31 AM       Refill xanax  X 1 and Pravastatin 40 #30 ( I entered Lipids, LFTs,CK)      ----- Message -----         From: Shelly Coss, CMA         Sent: 05/05/2013  10:14 AM           To: Hendricks Limes, MD            What do you mean? I see labs entered already just let patient know?       ----- Message -----         From: Hendricks Limes, MD         Sent: 05/05/2013   9:15 AM           To: Shelly Coss, CMA            OK for 1 month of both.labs entered             ------

## 2013-06-21 ENCOUNTER — Ambulatory Visit (INDEPENDENT_AMBULATORY_CARE_PROVIDER_SITE_OTHER): Payer: Commercial Managed Care - HMO | Admitting: Family Medicine

## 2013-06-21 VITALS — BP 116/64 | HR 78 | Temp 98.2°F | Resp 20 | Ht 64.5 in | Wt 137.4 lb

## 2013-06-21 DIAGNOSIS — M79609 Pain in unspecified limb: Secondary | ICD-10-CM

## 2013-06-21 DIAGNOSIS — S99929A Unspecified injury of unspecified foot, initial encounter: Secondary | ICD-10-CM

## 2013-06-21 DIAGNOSIS — S8990XA Unspecified injury of unspecified lower leg, initial encounter: Secondary | ICD-10-CM

## 2013-06-21 DIAGNOSIS — Z23 Encounter for immunization: Secondary | ICD-10-CM

## 2013-06-21 DIAGNOSIS — S99919A Unspecified injury of unspecified ankle, initial encounter: Secondary | ICD-10-CM

## 2013-06-21 NOTE — Progress Notes (Signed)
Chief Complaint:  Chief Complaint  Patient presents with  . stepped on rusty nail    pt stated that she stepped on a small rusty nail this morning.  she has not had tdap done in over 10 years.      HPI: Amy Tucker is a 68 y.o. female who is here for tetanus booster after stepping on rusty nail this morning.  She has had no fevers or chills. She is not UTD on her tetanus. She has no pain. Stepped on it barefooted.   Past Medical History  Diagnosis Date  . Osteopenia     BMD as per Dr Molli Posey, [place]  . Other and unspecified hyperlipidemia     NMR lipoprofile 2004: LDL 169 (2322/1419), TG 130, LDL goal =<100  . Unspecified hypothyroidism   . Other abnormal glucose     elevated FBS   Past Surgical History  Procedure Laterality Date  . Dilation and curettage of uterus    . Tonsillectomy and adenoidectomy    . External fixation ankle fracture  1997    post fall (no surgery)  . No colonoscopy      "I don't want to be put to sleep" [facility] reviewed)   History   Social History  . Marital Status: Married    Spouse Name: N/A    Number of Children: N/A  . Years of Education: N/A   Occupational History  . auditor    Social History Main Topics  . Smoking status: Former Smoker -- 2.50 packs/day for 20 years    Types: Cigarettes    Quit date: 01/23/1990  . Smokeless tobacco: Never Used     Comment: pt smoked from age [age]  (60- 1992),up to 2 ppd  . Alcohol Use: Yes     Comment: Rarely  . Drug Use: No  . Sexual Activity: Not on file   Other Topics Concern  . Not on file   Social History Narrative   Regular exercise- no   Low fat, carb         Family History  Problem Relation Age of Onset  . Thyroid disease Maternal Aunt     goiter  . Heart attack      17 M uncles & M aunts  . Diabetes      27 M uncles & M aunts  . Leukemia Father   . Diabetes Mother   . Hypertension Mother   . Coronary artery disease Mother   . Heart attack Mother 24  .  Stroke Mother     > 60  . Rheum arthritis Mother   . Kidney failure Mother   . Hypertension Brother   . Other Paternal Uncle   . Alzheimer's disease Paternal Aunt   . Cirrhosis Paternal Uncle    Allergies  Allergen Reactions  . Ciprofloxacin     Dizziness, nausea, headache  . Meloxicam     REACTION: GI intolerance  . Tramadol Hcl     REACTION: dizziness  . Zocor [Simvastatin]     D/Ced 01/13 due to muscle weakness   Prior to Admission medications   Medication Sig Start Date End Date Taking? Authorizing Provider  ALPRAZolam Duanne Moron) 0.25 MG tablet 1/2 every 8 hrs prn 05/05/13  Yes Hendricks Limes, MD  Calcium Carbonate-Vitamin D (CALCIUM + D PO) Take by mouth daily.     Yes Historical Provider, MD  Cholecalciferol (VITAMIN D3) 1000 UNITS CAPS Take by mouth daily.  Yes Historical Provider, MD  Hydrocodone-Acetaminophen 5-300 MG TABS Take 1 tablet by mouth every 6 (six) hours as needed. 07/16/12  Yes Yvonne R Lowne, DO  levothyroxine (SYNTHROID, LEVOTHROID) 75 MCG tablet TAKE ONE TABLET BY MOUTH EVERY DAY EXCEPT ON WEDNESDAYS TAKE ONE AND ONE-HALF TABLETS . 12/20/12  Yes Hendricks Limes, MD  meloxicam (MOBIC) 7.5 MG tablet Take 1 tablet (7.5 mg total) by mouth daily. 10/23/11  Yes Hendricks Limes, MD  Multiple Vitamin (MULTIVITAMIN) tablet Take 1 tablet by mouth daily.     Yes Historical Provider, MD  omeprazole (PRILOSEC OTC) 20 MG tablet Take 20 mg by mouth daily.   Yes Historical Provider, MD  pravastatin (PRAVACHOL) 40 MG tablet TAKE ONE TABLET BY MOUTH EVERY DAY 05/05/13  Yes Hendricks Limes, MD  traMADol (ULTRAM) 50 MG tablet Take 1 tablet (50 mg total) by mouth every 6 (six) hours as needed. 12/20/12  Yes Hendricks Limes, MD  vitamin C (ASCORBIC ACID) 500 MG tablet Take 500 mg by mouth daily.     Yes Historical Provider, MD     ROS: The patient denies fevers, chills, night sweats, unintentional weight loss, chest pain, palpitations, wheezing, dyspnea on exertion, nausea,  vomiting, abdominal pain, dysuria, hematuria, melena, numbness, weakness, or tingling.   All other systems have been reviewed and were otherwise negative with the exception of those mentioned in the HPI and as above.    PHYSICAL EXAM: Filed Vitals:   06/21/13 1148  BP: 116/64  Pulse: 78  Temp: 98.2 F (36.8 C)  Resp: 20   Filed Vitals:   06/21/13 1148  Height: 5' 4.5" (1.638 m)  Weight: 137 lb 6.4 oz (62.324 kg)   Body mass index is 23.23 kg/(m^2).  General: Alert, no acute distress HEENT:  Normocephalic, atraumatic, oropharynx patent. EOMI, PERRLA Cardiovascular:  Regular rate and rhythm, no rubs murmurs or gallops.  No Carotid bruits, radial pulse intact. No pedal edema.  Respiratory: Clear to auscultation bilaterally.  No wheezes, rales, or rhonchi.  No cyanosis, no use of accessory musculature GI: No organomegaly, abdomen is soft and non-tender, positive bowel sounds.  No masses. Skin: No rashes. Neurologic: Facial musculature symmetric. Psychiatric: Patient is appropriate throughout our interaction. Lymphatic: No cervical lymphadenopathy Musculoskeletal: Gait intact. RIght forefoot, puncture area, no erythema, nontender  LABS: Results for orders placed in visit on 16/10/96  BASIC METABOLIC PANEL      Result Value Ref Range   Sodium 136  135 - 145 mEq/L   Potassium 3.7  3.5 - 5.1 mEq/L   Chloride 101  96 - 112 mEq/L   CO2 32  19 - 32 mEq/L   Glucose, Bld 90  70 - 99 mg/dL   BUN 12  6 - 23 mg/dL   Creatinine, Ser 0.7  0.4 - 1.2 mg/dL   Calcium 9.0  8.4 - 10.5 mg/dL   GFR 84.35  >60.00 mL/min  HEPATIC FUNCTION PANEL      Result Value Ref Range   Total Bilirubin 0.7  0.3 - 1.2 mg/dL   Bilirubin, Direct 0.0  0.0 - 0.3 mg/dL   Alkaline Phosphatase 81  39 - 117 U/L   AST 23  0 - 37 U/L   ALT 17  0 - 35 U/L   Total Protein 7.2  6.0 - 8.3 g/dL   Albumin 4.2  3.5 - 5.2 g/dL  LIPID PANEL      Result Value Ref Range   Cholesterol 236 (*) 0 -  200 mg/dL   Triglycerides  146.0  0.0 - 149.0 mg/dL   HDL 50.20  >39.00 mg/dL   VLDL 29.2  0.0 - 40.0 mg/dL   Total CHOL/HDL Ratio 5    CK      Result Value Ref Range   Total CK 63  7 - 177 U/L  TSH      Result Value Ref Range   TSH 3.86  0.35 - 5.50 uIU/mL  VITAMIN D 1,25 DIHYDROXY      Result Value Ref Range   Vitamin D 1, 25 (OH)2 Total 65  18 - 72 pg/mL   Vitamin D3 1, 25 (OH)2 65     Vitamin D2 1, 25 (OH)2 <8    LDL CHOLESTEROL, DIRECT      Result Value Ref Range   Direct LDL 160.5       EKG/XRAY:   Primary read interpreted by Dr. Marin Comment at [facility].   ASSESSMENT/PLAN: Encounter Diagnoses  Name Primary?  . Need for vaccination Yes  . Foot injury    Tetanus booster given She will call me if she feels any s/sx of infection, decline abx today since doe snot do well on them Wound care as directed F/u prn  Gross sideeffects, risk and benefits, and alternatives of medications d/w patient. Patient is aware that all medications have potential sideeffects and we are unable to predict every sideeffect or drug-drug interaction that may occur.  Glenford Bayley, DO 06/21/2013 1:07 PM

## 2013-06-30 ENCOUNTER — Ambulatory Visit (INDEPENDENT_AMBULATORY_CARE_PROVIDER_SITE_OTHER): Payer: Commercial Managed Care - HMO | Admitting: Internal Medicine

## 2013-06-30 ENCOUNTER — Encounter: Payer: Self-pay | Admitting: Internal Medicine

## 2013-06-30 VITALS — BP 140/76 | HR 70 | Temp 98.2°F | Wt 137.6 lb

## 2013-06-30 DIAGNOSIS — M545 Low back pain, unspecified: Secondary | ICD-10-CM

## 2013-06-30 MED ORDER — HYDROCODONE-ACETAMINOPHEN 5-300 MG PO TABS: 1.0000 | ORAL_TABLET | Freq: Four times a day (QID) | ORAL | Status: DC | PRN

## 2013-06-30 MED ORDER — METHOCARBAMOL 500 MG PO TABS: ORAL_TABLET | ORAL | Status: DC

## 2013-06-30 NOTE — Patient Instructions (Signed)
The best exercises for the low back include freestyle swimming, stretch aerobics, and yoga.Cybex & Nautilus machines rather than dead weights are better for the back. 

## 2013-06-30 NOTE — Progress Notes (Signed)
Pre visit review using our clinic review tool, if applicable. No additional management support is needed unless otherwise documented below in the visit note. 

## 2013-06-30 NOTE — Progress Notes (Signed)
   Subjective:    Patient ID: Amy Tucker, female    DOB: 02/15/1945, 68 y.o.   MRN: [id number]  HPI  Her pain began 06/24/13 in the right lumbosacral area. This was exacerbated 06/27/13 after she slipped on water which was on the floor.  She now describes pain in this area with radiation as of today into  the right groin.  Initially it was 6-8 on 10 scale. Today is 10 over 10. It is described as variable... dull, sharp, or electric.  Position change aggravates it. Over-the-counter extra Tylenol helps as did oxycodone preparation. Heat and ice alternated provided some relief.   Review of Systems  She denies any associated incontinence of urine or stool.  There's been no radicular pain down the right lower extremity.       Objective:   Physical Exam She exhibits a classic "low back crawl" descending to the exam table and sitting up. No neuromuscular deficit present. Slight crepitus L knee.   General appearance is one of good health and nourishment w/o distress.  Eyes: No conjunctival inflammation or scleral icterus is present.  Heart:  Normal rate and regular rhythm. S1 and S2 normal without gallop, murmur, click, rub or other extra sounds     Lungs:Chest clear to auscultation; no wheezes, rhonchi,rales ,or rubs present.No increased work of breathing.   Abdomen: bowel sounds normal, soft and non-tender without masses, organomegaly or hernias noted.  No guarding or rebound . No tenderness over the flanks to percussion  Musculoskeletal: Able to lie flat and sit up without help. Negative straight leg raising bilaterally to 90 degrees. Gait normal including heel & toe walking.  Skin:Warm & dry.  Intact without suspicious lesions or rashes ; no jaundice or tenting  Lymphatic: No lymphadenopathy is noted about the head, neck, axilla              Assessment & Plan:  Acute low back pain.  See orders and recommendations. If no better physical therapy recommended

## 2013-06-30 NOTE — Progress Notes (Signed)
   Subjective:    Patient ID: Amy Tucker, female    DOB: Feb 19, 1945, 68 y.o.   MRN: [id number]  HPI  Patient began having right lower back pain the morning of 06/24/13. She fell by slipping on a wet kitchen floor on 06/27/13 and stated it may have worsened the pain. It is located in the right paraspinal muscle of the lower back. She denies radiculopathy until today when some burning sensation is noted in her right groin area. The back pain was 6-8/10 and today she feels it is over a 10/10. She characterizes the pain as dull, sharp, and electric. She feels it is worse with changes in position and better with OTC Extra Strength Tylenol. She has also tried heating pad and alternating with ice with some relief.  She has no PMHx of injury, surgery, or injections of the back or spine. She was lifting/moving furniture prior to this and feels that it is related. The pain is worse with bending activities.     Review of Systems  She denies fever, chills, sweats, unexplained weight loss, excessive fatigue.  Denies blurred vision, double vision, loss of vision, headaches, or passing out. Denies Chest pain, heart racing, skipping, or SOB. Denies abdominal pain, black tarry stool, rectal bleeding, diarrhea. Denies pain w/ urination, blood in urine, pus in urine Denies weakness, numbness, tingling in arms or legs, difficulty with balance or walking. Denies pain, color change, or swelling of joints Denies muscle pain, rash, change in color, or temperature of skin in the area of the pain. Denies loss of control of urine or bowels (incontinence) Denies abnormal bruising or bleeding  Objective:   Physical Exam        Assessment & Plan:

## 2013-07-11 ENCOUNTER — Encounter: Payer: Self-pay | Admitting: Internal Medicine

## 2013-07-11 DIAGNOSIS — D229 Melanocytic nevi, unspecified: Secondary | ICD-10-CM

## 2013-07-29 ENCOUNTER — Other Ambulatory Visit (INDEPENDENT_AMBULATORY_CARE_PROVIDER_SITE_OTHER): Payer: Commercial Managed Care - HMO

## 2013-07-29 ENCOUNTER — Other Ambulatory Visit: Payer: Self-pay | Admitting: Internal Medicine

## 2013-07-29 DIAGNOSIS — E785 Hyperlipidemia, unspecified: Secondary | ICD-10-CM

## 2013-07-29 LAB — HEPATIC FUNCTION PANEL
ALT: 18 U/L (ref 0–35)
AST: 21 U/L (ref 0–37)
Albumin: 3.7 g/dL (ref 3.5–5.2)
Alkaline Phosphatase: 94 U/L (ref 39–117)
BILIRUBIN DIRECT: 0.1 mg/dL (ref 0.0–0.3)
TOTAL PROTEIN: 6.9 g/dL (ref 6.0–8.3)
Total Bilirubin: 0.6 mg/dL (ref 0.2–1.2)

## 2013-07-29 LAB — CK: Total CK: 67 U/L (ref 7–177)

## 2013-07-29 LAB — LIPID PANEL
CHOLESTEROL: 249 mg/dL — AB (ref 0–200)
HDL: 43.9 mg/dL (ref >39.00)
LDL CALC: 155 mg/dL — AB (ref 0–99)
NonHDL: 205.1
TRIGLYCERIDES: 253 mg/dL — AB (ref 0.0–149.0)
Total CHOL/HDL Ratio: 6
VLDL: 50.6 mg/dL — AB (ref 0.0–40.0)

## 2013-07-31 ENCOUNTER — Other Ambulatory Visit: Payer: Self-pay | Admitting: Internal Medicine

## 2013-07-31 MED ORDER — PRAVASTATIN SODIUM 40 MG PO TABS: ORAL_TABLET | ORAL | Status: DC

## 2013-08-12 ENCOUNTER — Encounter: Payer: Self-pay | Admitting: Internal Medicine

## 2013-10-27 ENCOUNTER — Other Ambulatory Visit: Payer: Self-pay | Admitting: Internal Medicine

## 2013-10-27 ENCOUNTER — Ambulatory Visit (INDEPENDENT_AMBULATORY_CARE_PROVIDER_SITE_OTHER): Payer: Commercial Managed Care - HMO | Admitting: Internal Medicine

## 2013-10-27 ENCOUNTER — Encounter: Payer: Self-pay | Admitting: Internal Medicine

## 2013-10-27 ENCOUNTER — Other Ambulatory Visit (INDEPENDENT_AMBULATORY_CARE_PROVIDER_SITE_OTHER): Payer: Commercial Managed Care - HMO

## 2013-10-27 VITALS — BP 118/90 | HR 62 | Temp 98.3°F | Resp 12 | Ht 64.5 in | Wt 139.0 lb

## 2013-10-27 DIAGNOSIS — E038 Other specified hypothyroidism: Secondary | ICD-10-CM

## 2013-10-27 DIAGNOSIS — K219 Gastro-esophageal reflux disease without esophagitis: Secondary | ICD-10-CM

## 2013-10-27 DIAGNOSIS — F413 Other mixed anxiety disorders: Secondary | ICD-10-CM

## 2013-10-27 DIAGNOSIS — M8580 Other specified disorders of bone density and structure, unspecified site: Secondary | ICD-10-CM

## 2013-10-27 DIAGNOSIS — E785 Hyperlipidemia, unspecified: Secondary | ICD-10-CM

## 2013-10-27 DIAGNOSIS — R739 Hyperglycemia, unspecified: Secondary | ICD-10-CM

## 2013-10-27 LAB — CBC WITH DIFFERENTIAL/PLATELET
BASOS ABS: 0 10*3/uL (ref 0.0–0.1)
Basophils Relative: 0.5 % (ref 0.0–3.0)
EOS ABS: 0.2 10*3/uL (ref 0.0–0.7)
Eosinophils Relative: 3.1 % (ref 0.0–5.0)
HCT: 43.9 % (ref 36.0–46.0)
HEMOGLOBIN: 14.5 g/dL (ref 12.0–15.0)
LYMPHS PCT: 23 % (ref 12.0–46.0)
Lymphs Abs: 1.3 10*3/uL (ref 0.7–4.0)
MCHC: 33 g/dL (ref 30.0–36.0)
MCV: 91.4 fl (ref 78.0–100.0)
MONOS PCT: 5.8 % (ref 3.0–12.0)
Monocytes Absolute: 0.3 10*3/uL (ref 0.1–1.0)
NEUTROS ABS: 3.8 10*3/uL (ref 1.4–7.7)
NEUTROS PCT: 67.6 % (ref 43.0–77.0)
PLATELETS: 234 10*3/uL (ref 150.0–400.0)
RBC: 4.8 Mil/uL (ref 3.87–5.11)
RDW: 13.9 % (ref 11.5–15.5)
WBC: 5.6 10*3/uL (ref 4.0–10.5)

## 2013-10-27 LAB — HEMOGLOBIN A1C: HEMOGLOBIN A1C: 5.7 % (ref 4.6–6.5)

## 2013-10-27 LAB — BASIC METABOLIC PANEL
BUN: 13 mg/dL (ref 6–23)
CALCIUM: 9 mg/dL (ref 8.4–10.5)
CO2: 29 mEq/L (ref 19–32)
CREATININE: 0.7 mg/dL (ref 0.4–1.2)
Chloride: 99 mEq/L (ref 96–112)
GFR: 89.75 mL/min (ref >60.00)
GLUCOSE: 96 mg/dL (ref 70–99)
Potassium: 4.5 mEq/L (ref 3.5–5.1)
SODIUM: 135 meq/L (ref 135–145)

## 2013-10-27 LAB — VITAMIN D 25 HYDROXY (VIT D DEFICIENCY, FRACTURES): VITD: 29.01 ng/mL — AB (ref 30.00–100.00)

## 2013-10-27 LAB — TSH: TSH: 2.86 u[IU]/mL (ref 0.35–4.50)

## 2013-10-27 MED ORDER — ALPRAZOLAM 0.25 MG PO TABS: ORAL_TABLET | ORAL | Status: DC

## 2013-10-27 NOTE — Progress Notes (Signed)
Pre visit review using our clinic review tool, if applicable. No additional management support is needed unless otherwise documented below in the visit note. 

## 2013-10-27 NOTE — Assessment & Plan Note (Signed)
Vitamin D level 

## 2013-10-27 NOTE — Assessment & Plan Note (Signed)
CBC

## 2013-10-27 NOTE — Patient Instructions (Signed)
Your next office appointment will be determined based upon review of your pending labs. Those instructions will be transmitted to you through My Chart   ColoGuard may be an option in place of colonoscopy; you should explore the cost of this testing with your insurance company. If that test is positive; colonoscopy is mandatory. Medicare Part B covers this ONCE every 3 years for patient's meet the following conditions: #1 Ages 70-85 #2 No signs or symptoms of colorectal disease including but not limited to lower gastrointestinal pain; blood in the stool; positive stool cards; or abnormal immunochemical stool studies. #3 Patient must be of average risk of developing colorectal cancer and have no personal history of adenomatous polyps, colorectal cancer or inflammatory bowel disease such as Crohn's disease and ulcerative colitis. There also may be no family history of colorectal cancers or adenomatous polyps, familial adenomatous polyposis or hereditary non-polyposis colorectal cancer.

## 2013-10-27 NOTE — Assessment & Plan Note (Signed)
Lipoprofile, LFTs, TSH ,CK

## 2013-10-27 NOTE — Assessment & Plan Note (Signed)
TSH 

## 2013-10-27 NOTE — Progress Notes (Signed)
   Subjective:    Patient ID: Amy Tucker, female    DOB: [birth year], 68 y.o.   MRN: [id number]  HPI  She is here to assess active health issues & conditions. PMH, FH, & Social history verified & updated   A heart healthy diet is followed; no regular exercise .  Family history is positive for premature coronary disease. Advanced cholesterol testing reveals  LDL goal is less than 100 ; ideally < 70 . There is medication compliance with the statin every other day.  Low dose ASA not taken.  No colonoscopy; "I don't want to be put to sleep". SOC reviewed again.  Review of Systems  Specifically denied are  chest pain, palpitations, dyspnea, or claudication.  Significant abdominal symptoms, memory deficit, or myalgias not present unless she takes statin daily. With daily use she has BUE weakness.  Unexplained weight loss, abdominal pain, significant dyspepsia, dysphagia, melena, rectal bleeding, or persistently small caliber stools are denied.       Objective:   Physical Exam Gen.: Healthy and well-nourished in appearance. Alert, appropriate and cooperative throughout exam. Appears younger than stated age  Head: Normocephalic without obvious abnormalities  Eyes: Bilateral ptosis. No corneal or conjunctival inflammation noted. Pupils equal round reactive to light and accommodation. Extraocular motion intact.  Ears: External  ear exam reveals no significant lesions or deformities. Canals clear .TMs normal. Hearing is grossly decreased bilaterally. Nose: External nasal exam reveals no deformity or inflammation. Nasal mucosa are pink and moist. No lesions or exudates noted.   Mouth: Oral mucosa and oropharynx reveal no lesions or exudates. Teeth in good repair.Upper plate Neck: No deformities, masses, or tenderness noted. Range of motion &. Thyroid normal Lungs: Normal respiratory effort; chest expands symmetrically. Lungs are clear to auscultation without rales, wheezes, or increased work of  breathing. Heart: Normal rate and rhythm. Normal S1 and S2. No gallop, click, or rub. No murmur. Abdomen: Bowel sounds normal; abdomen soft and nontender. No masses, organomegaly or hernias noted. Genitalia:  as per Gyn  (OVERDUE!)                           Musculoskeletal/extremities: No deformity or scoliosis noted of  the thoracic or lumbar spine. No clubbing, cyanosis, edema, or significant extremity  deformity noted. Range of motion normal .Tone & strength normal. Hand joints normal Fingernail health good. Able to lie down & sit up w/o help. Negative SLR bilaterally Vascular: Carotid, radial artery, dorsalis pedis and  posterior tibial pulses are full and equal. No bruits present. Neurologic: Alert and oriented x3. Deep tendon reflexes symmetrical and normal.  Gait normal .     Skin: Intact without suspicious lesions or rashes. Lymph: No cervical, axillary lymphadenopathy present. Psych: Mood and affect are normal. Normally interactive                                                                                        Assessment & Plan:  See Current Assessment & Plan in Problem List under specific Diagnosis

## 2013-10-27 NOTE — Assessment & Plan Note (Signed)
A1c

## 2013-10-29 ENCOUNTER — Encounter: Payer: Self-pay | Admitting: Internal Medicine

## 2013-10-29 LAB — NMR LIPOPROFILE WITH LIPIDS
Cholesterol, Total: 210 mg/dL — ABNORMAL HIGH (ref 100–199)
HDL Particle Number: 30.8 umol/L (ref >=30.5)
HDL SIZE: 8.8 nm — AB (ref >=9.2)
HDL-C: 51 mg/dL (ref >39)
LARGE HDL: 3.6 umol/L — AB (ref >=4.8)
LDL (calc): 131 mg/dL — ABNORMAL HIGH (ref 0–99)
LDL PARTICLE NUMBER: 1964 nmol/L — AB (ref <1000)
LDL SIZE: 20.7 nm (ref >=20.8)
LP-IR Score: 40 (ref <=45)
SMALL LDL PARTICLE NUMBER: 1115 nmol/L — AB (ref <=527)
Triglycerides: 140 mg/dL (ref 0–149)
VLDL SIZE: 42.4 nm (ref <=46.6)

## 2013-10-30 ENCOUNTER — Other Ambulatory Visit: Payer: Self-pay | Admitting: Internal Medicine

## 2013-10-30 ENCOUNTER — Encounter: Payer: Self-pay | Admitting: Internal Medicine

## 2013-10-30 DIAGNOSIS — E785 Hyperlipidemia, unspecified: Secondary | ICD-10-CM

## 2013-10-30 MED ORDER — EZETIMIBE 10 MG PO TABS: 10.0000 mg | ORAL_TABLET | Freq: Every day | ORAL | Status: DC

## 2013-12-11 ENCOUNTER — Encounter: Payer: Self-pay | Admitting: Internal Medicine

## 2013-12-11 ENCOUNTER — Ambulatory Visit (INDEPENDENT_AMBULATORY_CARE_PROVIDER_SITE_OTHER): Payer: Commercial Managed Care - HMO | Admitting: Family Medicine

## 2013-12-11 ENCOUNTER — Ambulatory Visit: Payer: Commercial Managed Care - HMO | Admitting: Internal Medicine

## 2013-12-11 DIAGNOSIS — J0120 Acute ethmoidal sinusitis, unspecified: Secondary | ICD-10-CM

## 2013-12-11 MED ORDER — GUAIFENESIN ER 1200 MG PO TB12: 1.0000 | ORAL_TABLET | Freq: Two times a day (BID) | ORAL | Status: DC | PRN

## 2013-12-11 MED ORDER — HYDROCOD POLST-CHLORPHEN POLST 10-8 MG/5ML PO LQCR: 5.0000 mL | Freq: Two times a day (BID) | ORAL | Status: DC | PRN

## 2013-12-11 MED ORDER — AMOXICILLIN 500 MG PO CAPS: 500.0000 mg | ORAL_CAPSULE | Freq: Three times a day (TID) | ORAL | Status: DC

## 2013-12-11 NOTE — Progress Notes (Signed)
Subjective:    Patient ID: Amy Tucker, female    DOB: 1945/08/10, 68 y.o.   MRN: [id number] This chart was scribed for Delman Cheadle, MD by Zola Button, Medical Scribe. This patient was seen in Room 11 and the patient's care was started at 10:57 AM.  Chief Complaint  Patient presents with  . Cough    x 1 week  . Sore Throat    feels like something in throat    HPI HPI Comments: Amy Tucker is a 68 y.o. female with a hx of hypothyroidism and GERD who presents to the [facility] complaining of gradual onset URI symptoms that began about 1 week ago. Patient reports having throat discomfort, dry cough, decreased appetite, nausea from cough drops, and low-grade fever. Patient feels like there is mucus stuck in her throat. She has tried Tylenol and Airborne for her symptoms. Patient has not tried any mouthwashes or saltwater gargles. She denies rhinorrhea, swollen glands, sore throat and congestion. Patient has not received flu vaccine this year and denies sick contacts. She has taken amoxacillin in the past with no problems.  Past Medical History  Diagnosis Date  . Osteopenia     BMD as per Dr Molli Posey, [place]  . Other and unspecified hyperlipidemia     NMR lipoprofile 2004: LDL 169 (2322/1419), TG 130, LDL goal =<100  . Unspecified hypothyroidism   . Other abnormal glucose     elevated FBS   Current Outpatient Prescriptions on File Prior to Visit  Medication Sig Dispense Refill  . ALPRAZolam (XANAX) 0.25 MG tablet 1/2 every 8 hrs prn 30 tablet 0  . Calcium Carbonate-Vitamin D (CALCIUM + D PO) Take by mouth daily.      . Cholecalciferol (VITAMIN D3) 1000 UNITS CAPS Take by mouth daily.      [name] levothyroxine (SYNTHROID, LEVOTHROID) 75 MCG tablet TAKE ONE TABLET BY MOUTH EVERY DAY EXCEPT ON WEDNESDAYS TAKE ONE AND ONE-HALF TABLETS . 99 tablet 3  . meloxicam (MOBIC) 7.5 MG tablet Take 1 tablet (7.5 mg total) by mouth daily. 30 tablet 0  . methocarbamol (ROBAXIN) 500  MG tablet 1 qhs prn 10 tablet 0  . Multiple Vitamin (MULTIVITAMIN) tablet Take 1 tablet by mouth daily.      [name] omeprazole (PRILOSEC OTC) 20 MG tablet Take 20 mg by mouth daily.    . pravastatin (PRAVACHOL) 40 MG tablet TAKE ONE TABLET BY MOUTH EVERY DAY 30 tablet 5  . vitamin C (ASCORBIC ACID) 500 MG tablet Take 500 mg by mouth daily.      [name] ezetimibe (ZETIA) 10 MG tablet Take 1 tablet (10 mg total) by mouth daily. 30 tablet 2  . Hydrocodone-Acetaminophen 5-300 MG TABS Take 1 tablet by mouth every 6 (six) hours as needed. 30 each 0   No current facility-administered medications on file prior to visit.   Allergies  Allergen Reactions  . Ciprofloxacin     Dizziness, nausea, headache  . Meloxicam     REACTION: GI intolerance  . Tramadol Hcl     REACTION: dizziness  . Zocor [Simvastatin]     D/Ced 01/13 due to muscle weakness     Review of Systems  Constitutional: Positive for fever.  HENT: Negative for congestion, rhinorrhea and sore throat.   Respiratory: Positive for cough.        Objective:  There were no vitals taken for this visit.   Physical Exam  Constitutional: She is oriented to person,  place, and time. She appears well-developed and well-nourished. No distress.  HENT:  Head: Normocephalic and atraumatic.  Right Ear: Tympanic membrane normal.  Left Ear: Tympanic membrane normal.  Nose: Nose normal.  Oropharynx erythema with copious amounts of clear mucus.  Eyes: Pupils are equal, round, and reactive to light.  Neck: Neck supple. No thyromegaly present.  Cardiovascular: Normal rate, regular rhythm, S1 normal, S2 normal and normal heart sounds.   No murmur heard. Pulmonary/Chest: Effort normal and breath sounds normal. No respiratory distress. She has no wheezes. She has no rales.  Good air movement.  Musculoskeletal: She exhibits no edema.  Lymphadenopathy:    She has no cervical adenopathy.       Right: No supraclavicular adenopathy present.       Left: No  supraclavicular adenopathy present.  Neurological: She is alert and oriented to person, place, and time. No cranial nerve deficit.  Skin: Skin is warm and dry. No rash noted.  Psychiatric: She has a normal mood and affect. Her behavior is normal.  Nursing note and vitals reviewed.         Assessment & Plan:   Acute ethmoidal sinusitis, recurrence not specified Reviewed symptomatic care Meds ordered this encounter  Medications  . amoxicillin (AMOXIL) 500 MG capsule    Sig: Take 1 capsule (500 mg total) by mouth 3 (three) times daily.    Dispense:  30 capsule    Refill:  0  . Guaifenesin (MUCINEX MAXIMUM STRENGTH) 1200 MG TB12    Sig: Take 1 tablet (1,200 mg total) by mouth every 12 (twelve) hours as needed.    Dispense:  14 tablet    Refill:  1  . chlorpheniramine-HYDROcodone (TUSSIONEX PENNKINETIC ER) 10-8 MG/5ML LQCR    Sig: Take 5 mLs by mouth every 12 (twelve) hours as needed.    Dispense:  120 mL    Refill:  0    I personally performed the services described in this documentation, which was scribed in my presence. The recorded information has been reviewed and considered, and addended by me as needed.  Delman Cheadle, MD MPH

## 2013-12-11 NOTE — Patient Instructions (Signed)
I recommend frequent warm salt water gargles, hot tea with honey and lemon, rest, and handwashing.  Hot showers or breathing in steam may help loosen the congestion.  Try a netti pot or sinus rinse is also likely to help you feel better and keep this from progressing.  Use nasal saline as frequently as your can and use a cool-mist humidifier at night.   Upper Respiratory Infection, Adult An upper respiratory infection (URI) is also sometimes known as the common cold. The upper respiratory tract includes the nose, sinuses, throat, trachea, and bronchi. Bronchi are the airways leading to the lungs. Most people improve within 1 week, but symptoms can last up to 2 weeks. A residual cough may last even longer.  CAUSES Many different viruses can infect the tissues lining the upper respiratory tract. The tissues become irritated and inflamed and often become very moist. Mucus production is also common. A cold is contagious. You can easily spread the virus to others by oral contact. This includes kissing, sharing a glass, coughing, or sneezing. Touching your mouth or nose and then touching a surface, which is then touched by another person, can also spread the virus. SYMPTOMS  Symptoms typically develop 1 to 3 days after you come in contact with a cold virus. Symptoms vary from person to person. They may include:  Runny nose.  Sneezing.  Nasal congestion.  Sinus irritation.  Sore throat.  Loss of voice (laryngitis).  Cough.  Fatigue.  Muscle aches.  Loss of appetite.  Headache.  Low-grade fever. DIAGNOSIS  You might diagnose your own cold based on familiar symptoms, since most people get a cold 2 to 3 times a year. Your caregiver can confirm this based on your exam. Most importantly, your caregiver can check that your symptoms are not due to another disease such as strep throat, sinusitis, pneumonia, asthma, or epiglottitis. Blood tests, throat tests, and X-rays are not necessary to  diagnose a common cold, but they may sometimes be helpful in excluding other more serious diseases. Your caregiver will decide if any further tests are required. RISKS AND COMPLICATIONS  You may be at risk for a more severe case of the common cold if you smoke cigarettes, have chronic heart disease (such as heart failure) or lung disease (such as asthma), or if you have a weakened immune system. The very young and very old are also at risk for more serious infections. Bacterial sinusitis, middle ear infections, and bacterial pneumonia can complicate the common cold. The common cold can worsen asthma and chronic obstructive pulmonary disease (COPD). Sometimes, these complications can require emergency medical care and may be life-threatening. PREVENTION  The best way to protect against getting a cold is to practice good hygiene. Avoid oral or hand contact with people with cold symptoms. Wash your hands often if contact occurs. There is no clear evidence that vitamin C, vitamin E, echinacea, or exercise reduces the chance of developing a cold. However, it is always recommended to get plenty of rest and practice good nutrition. TREATMENT  Treatment is directed at relieving symptoms. There is no cure. Antibiotics are not effective, because the infection is caused by a virus, not by bacteria. Treatment may include:  Increased fluid intake. Sports drinks offer valuable electrolytes, sugars, and fluids.  Breathing heated mist or steam (vaporizer or shower).  Eating chicken soup or other clear broths, and maintaining good nutrition.  Getting plenty of rest.  Using gargles or lozenges for comfort.  Controlling fevers with ibuprofen or  acetaminophen as directed by your caregiver.  Increasing usage of your inhaler if you have asthma. Zinc gel and zinc lozenges, taken in the first 24 hours of the common cold, can shorten the duration and lessen the severity of symptoms. Pain medicines may help with fever,  muscle aches, and throat pain. A variety of non-prescription medicines are available to treat congestion and runny nose. Your caregiver can make recommendations and may suggest nasal or lung inhalers for other symptoms.  HOME CARE INSTRUCTIONS   Only take over-the-counter or prescription medicines for pain, discomfort, or fever as directed by your caregiver.  Use a warm mist humidifier or inhale steam from a shower to increase air moisture. This may keep secretions moist and make it easier to breathe.  Drink enough water and fluids to keep your urine clear or pale yellow.  Rest as needed.  Return to work when your temperature has returned to normal or as your caregiver advises. You may need to stay home longer to avoid infecting others. You can also use a face mask and careful hand washing to prevent spread of the virus. SEEK MEDICAL CARE IF:   After the first few days, you feel you are getting worse rather than better.  You need your caregiver's advice about medicines to control symptoms.  You develop chills, worsening shortness of breath, or brown or red sputum. These may be signs of pneumonia.  You develop yellow or brown nasal discharge or pain in the face, especially when you bend forward. These may be signs of sinusitis.  You develop a fever, swollen neck glands, pain with swallowing, or white areas in the back of your throat. These may be signs of strep throat. SEEK IMMEDIATE MEDICAL CARE IF:   You have a fever.  You develop severe or persistent headache, ear pain, sinus pain, or chest pain.  You develop wheezing, a prolonged cough, cough up blood, or have a change in your usual mucus (if you have chronic lung disease).  You develop sore muscles or a stiff neck. Document Released: 07/05/2000 Document Revised: 04/03/2011 Document Reviewed: 04/16/2013 [facility] Patient Information 2015 [place], [place]. This information is not intended to replace advice given to you by your health  care provider. Make sure you discuss any questions you have with your health care provider.

## 2013-12-27 ENCOUNTER — Encounter: Payer: Self-pay | Admitting: Internal Medicine

## 2013-12-29 ENCOUNTER — Other Ambulatory Visit: Payer: Self-pay

## 2013-12-29 MED ORDER — LEVOTHYROXINE SODIUM 75 MCG PO TABS: ORAL_TABLET | ORAL | Status: DC

## 2014-03-20 IMAGING — CT CT ABD-PELV W/O CM
2 of 4 series · 16 of 46 positions shown, 18 images · non-contrast
Comparison: Abdomen ultrasound 07/12/2012.

CLINICAL DATA: 67-year-old female with left flank pain radiating to
the left low back and abdomen.  Remote history of renal stones.

CT ABDOMEN AND PELVIS WITHOUT CONTRAST
TECHNIQUE: Multidetector CT imaging of the abdomen and pelvis was
performed following the standard protocol without intravenous
contrast.

[Series 2: renal stone < 200 lbs 5.0 b31f · axial · 0.64mm/px · z∈[-467,-77]mm · 13 of 86 slices shown, 15 images]
[im 4/86  soft-tissue]
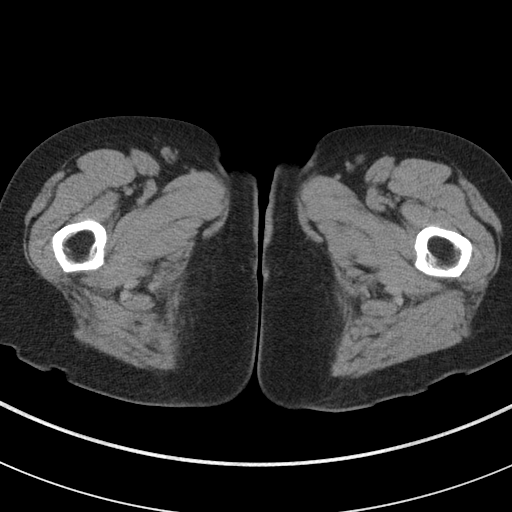
[im 4/86  bone]
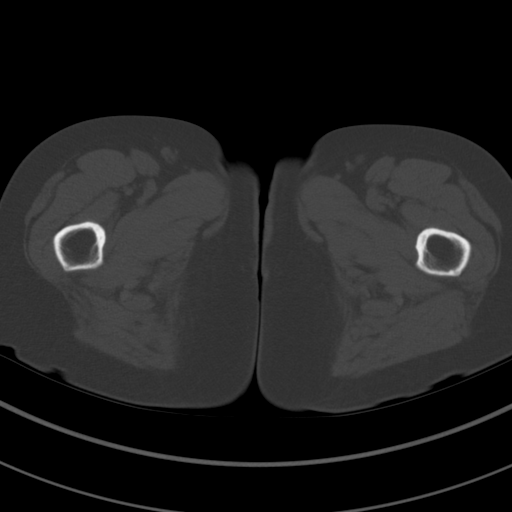
[im 11/86  soft-tissue]
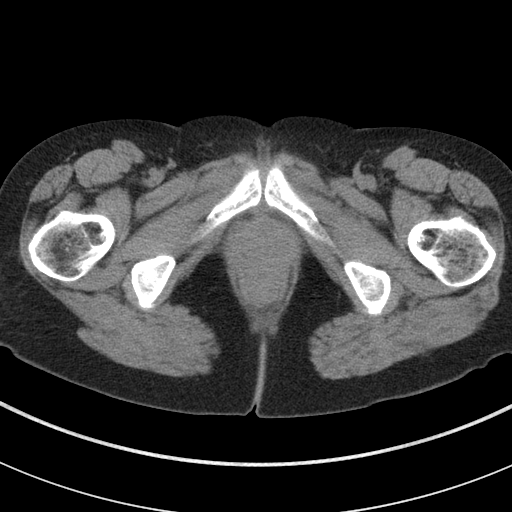
[im 18/86  soft-tissue]
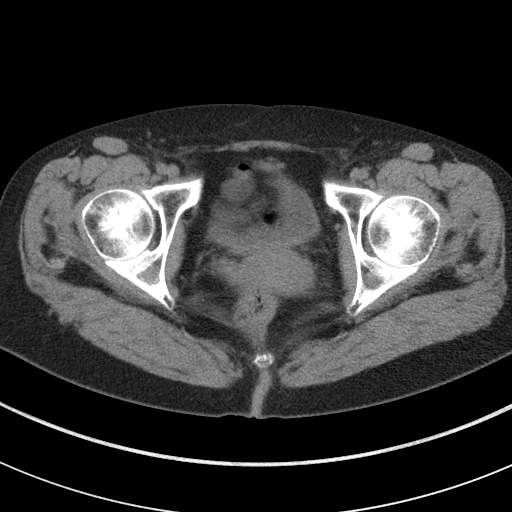
[im 24/86  soft-tissue]
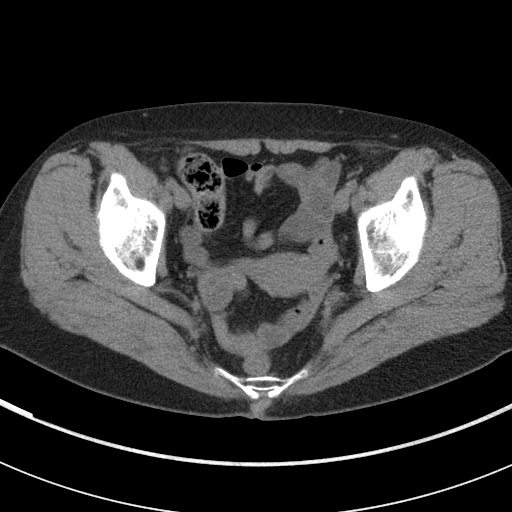
[im 31/86  soft-tissue]
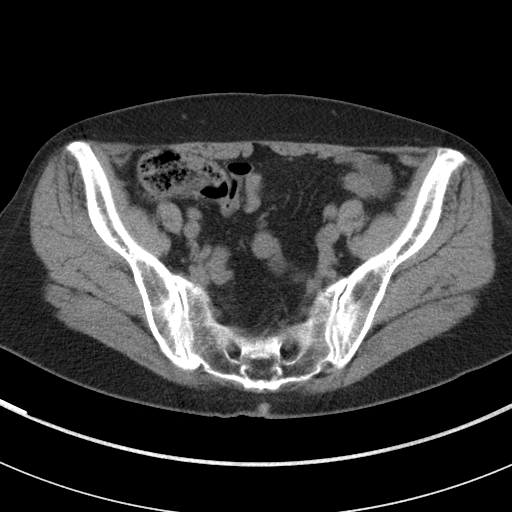
[im 38/86  soft-tissue]
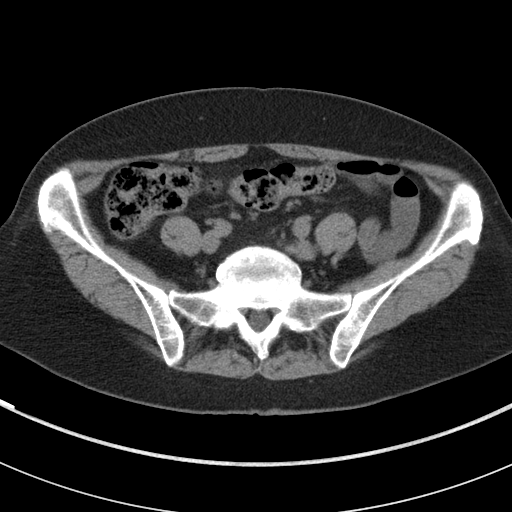
[im 45/86  soft-tissue]
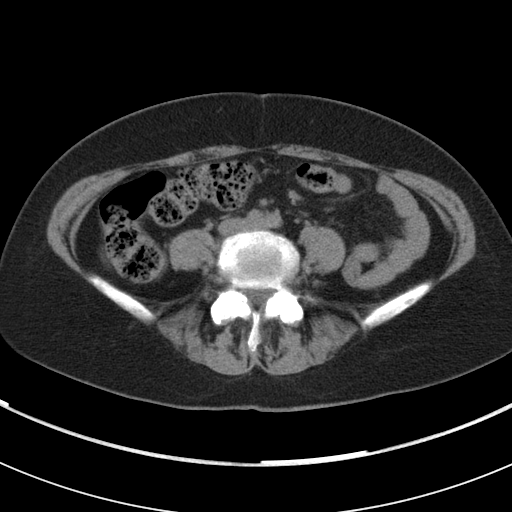
[im 48/86  soft-tissue]
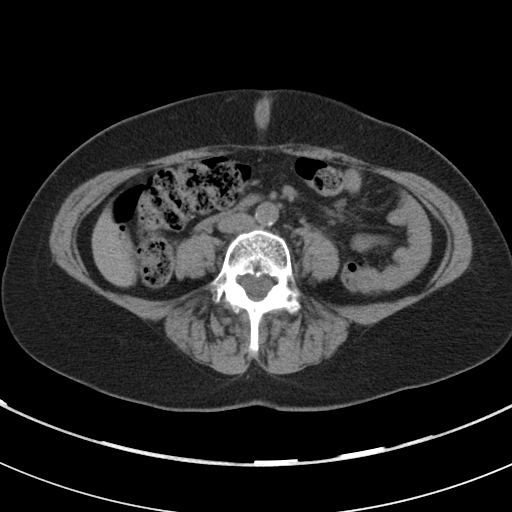
[im 55/86  soft-tissue]
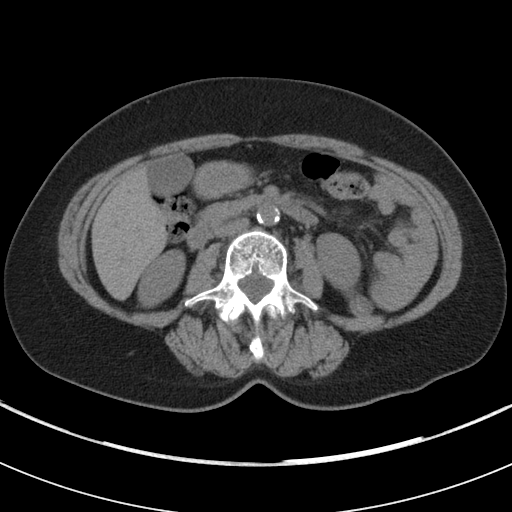
[im 55/86  bone]
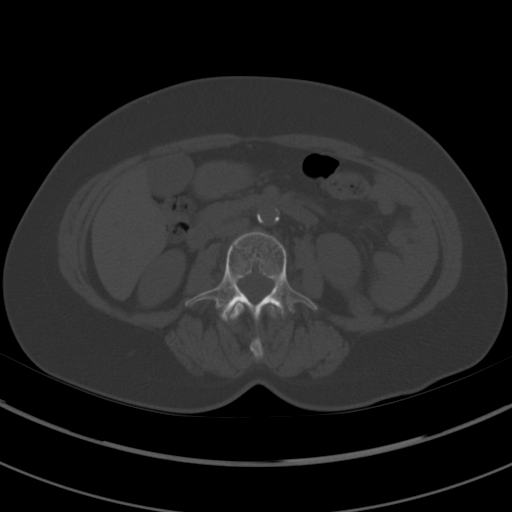
[im 62/86  soft-tissue]
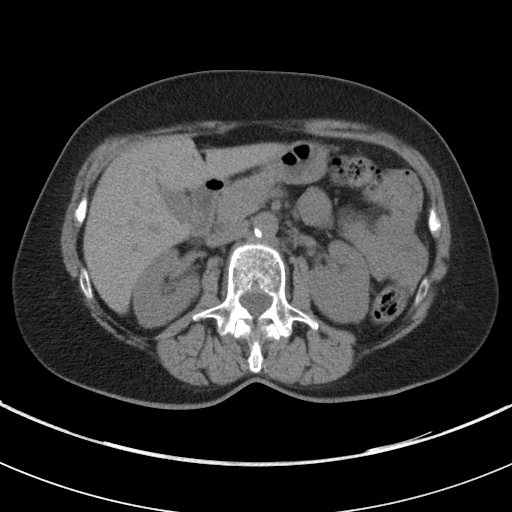
[im 69/86  soft-tissue]
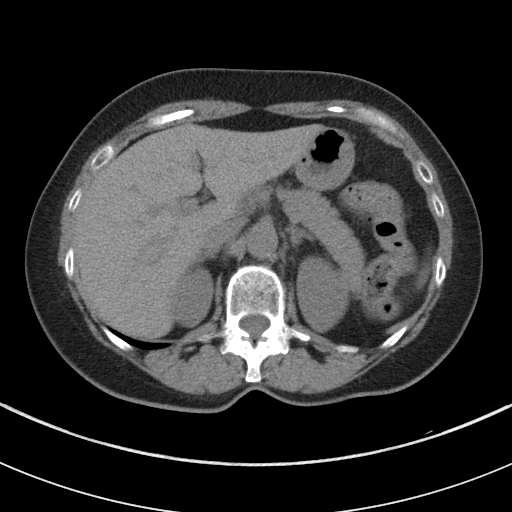
[im 75/86  soft-tissue]
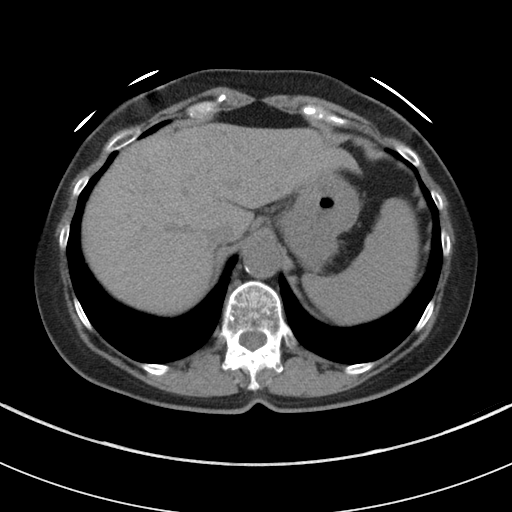
[im 82/86  soft-tissue]
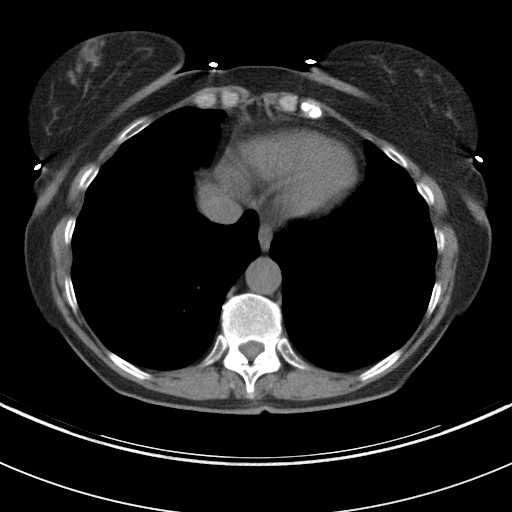

[Series 4: renal stone 3.0 coronal · coronal · 0.68mm/px · 3 of 70 slices shown]
[im 24/70  soft-tissue]
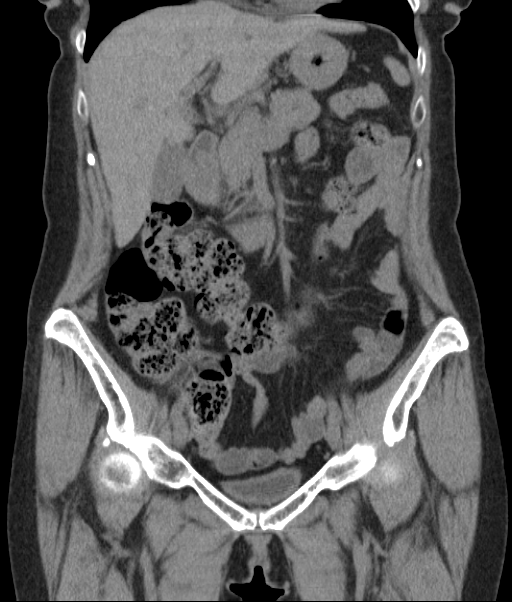
[im 31/70  soft-tissue]
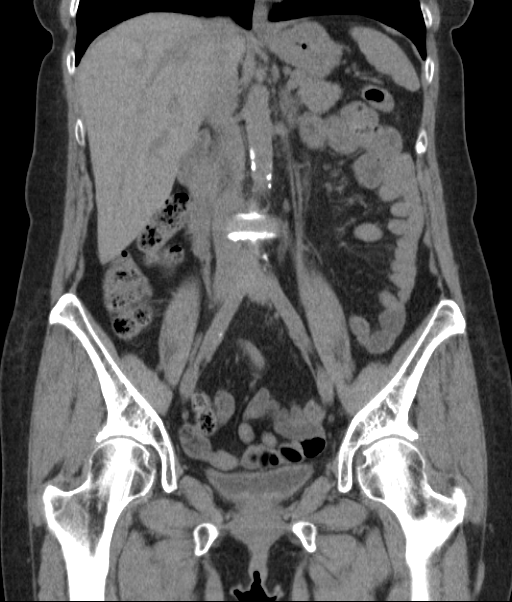
[im 39/70  soft-tissue]
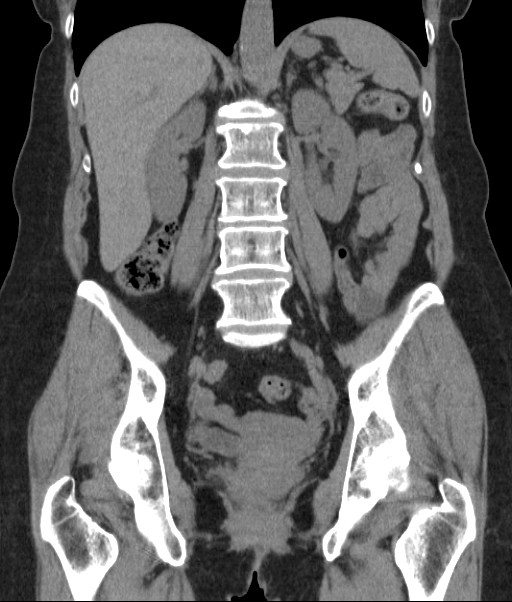

[16 of 46 positions shown; findings below may reference images not displayed]

FINDINGS: Negative lung bases.  No pericardial or pleural effusion.
Lower lumbar facet hypertrophy.  Mild grade 1 anterolisthesis at L4-
L5. No acute osseous abnormality identified.

No pelvic free fluid.  Negative noncontrast uterus and adnexa
(there is a 17 mm right adnexal cyst with densitometry compatible
with simple fluid).  Negative distal colon.  The left colon is
decompressed.  Negative transverse and right colon.  The cecum is
partially located in the pelvis.  The appendix is normal (series 2
image 58 and coronal image 41).  Negative terminal ileum.  No
dilated small bowel loops.  Decompressed stomach and duodenum.

The liver is remarkable for occasional small low density areas.
The largest of these measure simple fluid density.  Otherwise
negative noncontrast liver parenchyma.

The gallbladder, spleen, pancreas, and adrenal glands are within
normal limits.  Intermittent calcified atherosclerosis of the
aorta.  The iliac arteries are relatively spared.

No right hydronephrosis, nephrolithiasis or perinephric stranding.
No right hydroureter.  Negative course of the right ureter.  Right
perimetrial phleboliths incidentally noted.
No left hydronephrosis or perinephric stranding.  No left
nephrolithiasis or hydroureter.  Negative course of the left
ureter.  The bladder is decompressed and diminutive.  No focal
inflammatory stranding identified in the abdomen or pelvis.
IMPRESSION: 1.  Negative for urologic calculus, obstructive uropathy, or focal
inflammation in the abdomen or pelvis.  Normal appendix.
2.  Incidental small low density areas in the liver and right
adnexa, most likely benign/cysts.
3.  Mild grade 1 spondylolisthesis at L4-L5.  Lower lumbar facet
degeneration.

## 2014-07-13 ENCOUNTER — Other Ambulatory Visit: Payer: Self-pay | Admitting: Internal Medicine

## 2014-08-13 ENCOUNTER — Other Ambulatory Visit: Payer: Self-pay | Admitting: Internal Medicine

## 2014-08-14 ENCOUNTER — Other Ambulatory Visit: Payer: Self-pay | Admitting: Emergency Medicine

## 2014-08-14 DIAGNOSIS — F413 Other mixed anxiety disorders: Secondary | ICD-10-CM

## 2014-08-14 MED ORDER — ALPRAZOLAM 0.25 MG PO TABS: ORAL_TABLET | ORAL | Status: DC

## 2014-08-14 NOTE — Telephone Encounter (Signed)
Refill request faxed to pharm

## 2014-11-09 ENCOUNTER — Ambulatory Visit (INDEPENDENT_AMBULATORY_CARE_PROVIDER_SITE_OTHER): Payer: Commercial Managed Care - HMO | Admitting: Internal Medicine

## 2014-11-09 ENCOUNTER — Encounter: Payer: Self-pay | Admitting: Internal Medicine

## 2014-11-09 ENCOUNTER — Other Ambulatory Visit (INDEPENDENT_AMBULATORY_CARE_PROVIDER_SITE_OTHER): Payer: Commercial Managed Care - HMO

## 2014-11-09 VITALS — BP 120/88 | HR 74 | Temp 98.1°F | Ht 64.5 in | Wt 139.0 lb

## 2014-11-09 DIAGNOSIS — R739 Hyperglycemia, unspecified: Secondary | ICD-10-CM

## 2014-11-09 DIAGNOSIS — E785 Hyperlipidemia, unspecified: Secondary | ICD-10-CM

## 2014-11-09 DIAGNOSIS — K219 Gastro-esophageal reflux disease without esophagitis: Secondary | ICD-10-CM

## 2014-11-09 DIAGNOSIS — E038 Other specified hypothyroidism: Secondary | ICD-10-CM

## 2014-11-09 LAB — LIPID PANEL
CHOL/HDL RATIO: 4
CHOLESTEROL: 213 mg/dL — AB (ref 0–200)
HDL: 47.9 mg/dL (ref >39.00)
NONHDL: 164.92
TRIGLYCERIDES: 227 mg/dL — AB (ref 0.0–149.0)
VLDL: 45.4 mg/dL — AB (ref 0.0–40.0)

## 2014-11-09 LAB — BASIC METABOLIC PANEL
BUN: 10 mg/dL (ref 6–23)
CHLORIDE: 101 meq/L (ref 96–112)
CO2: 31 mEq/L (ref 19–32)
CREATININE: 0.79 mg/dL (ref 0.40–1.20)
Calcium: 9.5 mg/dL (ref 8.4–10.5)
GFR: 76.54 mL/min (ref >60.00)
GLUCOSE: 99 mg/dL (ref 70–99)
POTASSIUM: 4.3 meq/L (ref 3.5–5.1)
Sodium: 138 mEq/L (ref 135–145)

## 2014-11-09 LAB — LDL CHOLESTEROL, DIRECT: LDL DIRECT: 125 mg/dL

## 2014-11-09 LAB — CBC WITH DIFFERENTIAL/PLATELET
BASOS PCT: 0.6 % (ref 0.0–3.0)
Basophils Absolute: 0 10*3/uL (ref 0.0–0.1)
EOS ABS: 0.1 10*3/uL (ref 0.0–0.7)
EOS PCT: 2.5 % (ref 0.0–5.0)
HCT: 43.8 % (ref 36.0–46.0)
HEMOGLOBIN: 14.6 g/dL (ref 12.0–15.0)
LYMPHS ABS: 1.4 10*3/uL (ref 0.7–4.0)
Lymphocytes Relative: 23.6 % (ref 12.0–46.0)
MCHC: 33.4 g/dL (ref 30.0–36.0)
MCV: 90.1 fl (ref 78.0–100.0)
MONO ABS: 0.3 10*3/uL (ref 0.1–1.0)
Monocytes Relative: 5.5 % (ref 3.0–12.0)
NEUTROS PCT: 67.8 % (ref 43.0–77.0)
Neutro Abs: 3.9 10*3/uL (ref 1.4–7.7)
PLATELETS: 239 10*3/uL (ref 150.0–400.0)
RBC: 4.86 Mil/uL (ref 3.87–5.11)
RDW: 14.1 % (ref 11.5–15.5)
WBC: 5.8 10*3/uL (ref 4.0–10.5)

## 2014-11-09 LAB — TSH: TSH: 4.67 u[IU]/mL — ABNORMAL HIGH (ref 0.35–4.50)

## 2014-11-09 LAB — HEPATIC FUNCTION PANEL
ALT: 20 U/L (ref 0–35)
AST: 22 U/L (ref 0–37)
Albumin: 4.2 g/dL (ref 3.5–5.2)
Alkaline Phosphatase: 80 U/L (ref 39–117)
BILIRUBIN DIRECT: 0.1 mg/dL (ref 0.0–0.3)
BILIRUBIN TOTAL: 0.5 mg/dL (ref 0.2–1.2)
Total Protein: 7.2 g/dL (ref 6.0–8.3)

## 2014-11-09 LAB — HEMOGLOBIN A1C: Hgb A1c MFr Bld: 5.7 % (ref 4.6–6.5)

## 2014-11-09 NOTE — Progress Notes (Signed)
   Subjective:    Patient ID: Amy Tucker, female    DOB: 05/22/1945, 69 y.o.   MRN: [id number]  HPI The patient is here to assess status of active health conditions.  PMH, FH, & Social History reviewed & updated.No change in [place] as recorded. She is on a heart healthy diet. She walks at least 1.5 miles a day 5-6 days a week without cardiopulmonary symptoms. She is not monitoring blood pressure at home. She has been compliant with her medications and has no adverse effects  She has not had a colonoscopy to date; she states "I don't want to be put to sleep". She has no active GI symptoms.  Review of Systems  Chest pain, palpitations, tachycardia, exertional dyspnea, paroxysmal nocturnal dyspnea, claudication or edema are absent. No unexplained weight loss, abdominal pain, significant dyspepsia, dysphagia, melena, rectal bleeding, or persistently small caliber stools. Dysuria, pyuria, hematuria, frequency, nocturia or polyuria are denied. Change in hair, skin, nails denied. No bowel changes of constipation or diarrhea. No intolerance to heat or cold.     Objective:   Physical Exam  Pertinent or positive findings include: She appears much younger than stated age. She has an upper dental partial. She has decreased hearing bilaterally. She has isolated DIP osteoarthritic changes of the hands. General appearance :adequately nourished; in no distress.  Eyes: No conjunctival inflammation or scleral icterus is present.  Oral exam:  Lips and gums are healthy appearing.There is no oropharyngeal erythema or exudate noted. Dental hygiene is good.  Heart:  Normal rate and regular rhythm. S1 and S2 normal without gallop, murmur, click, rub or other extra sounds    Lungs:Chest clear to auscultation; no wheezes, rhonchi,rales ,or rubs present.No increased work of breathing.   Abdomen: bowel sounds normal, soft and non-tender without masses, organomegaly or hernias noted.  No guarding or rebound. No  flank tenderness to percussion.  Vascular : all pulses equal ; no bruits present.  Skin:Warm & dry.  Intact without suspicious lesions or rashes ; no tenting or jaundice   Lymphatic: No lymphadenopathy is noted about the head, neck, axilla.   Neuro: Strength, tone & DTRs normal.      Assessment & Plan:  See Current Assessment & Plan in Problem List under specific Diagnosis

## 2014-11-09 NOTE — Assessment & Plan Note (Signed)
A1c

## 2014-11-09 NOTE — Progress Notes (Signed)
Pre visit review using our clinic review tool, if applicable. No additional management support is needed unless otherwise documented below in the visit note. 

## 2014-11-09 NOTE — Assessment & Plan Note (Signed)
Lipids, LFTs, TSH  

## 2014-11-09 NOTE — Patient Instructions (Signed)
  Your next office appointment will be determined based upon review of your pending labs.Those written interpretation of the lab results and instructions will be transmitted to you by My Chart Critical results will be called.  Followup as needed for any active or acute issue. Please report any significant change in your symptoms.  ColoGuard may be an option in place of colonoscopy; you should explore the cost of this testing with your insurance company. If that test is positive; colonoscopy is mandatory. Medicare Part B covers this ONCE every 3 years for patient's meet the following conditions: #1 Ages 7-85 #2 No signs or symptoms of colorectal disease including but not limited to lower gastrointestinal pain; blood in the stool; positive stool cards; or abnormal immunochemical stool studies. #3 Patient must be of average risk of developing colorectal cancer and have no personal history of adenomatous polyps, colorectal cancer or inflammatory bowel disease such as Crohn's disease and ulcerative colitis. There also may be no family history of colorectal cancers or adenomatous polyps, familial adenomatous polyposis or hereditary non-polyposis colorectal cancer.

## 2014-11-09 NOTE — Assessment & Plan Note (Signed)
TSH 

## 2014-11-09 NOTE — Assessment & Plan Note (Signed)
CBC  Anti-reflux interventions discussed

## 2014-11-11 ENCOUNTER — Other Ambulatory Visit: Payer: Self-pay | Admitting: Internal Medicine

## 2014-11-11 DIAGNOSIS — E038 Other specified hypothyroidism: Secondary | ICD-10-CM

## 2014-11-27 DIAGNOSIS — H524 Presbyopia: Secondary | ICD-10-CM | POA: Diagnosis not present

## 2014-11-27 DIAGNOSIS — H5210 Myopia, unspecified eye: Secondary | ICD-10-CM | POA: Diagnosis not present

## 2014-12-22 ENCOUNTER — Other Ambulatory Visit: Payer: Self-pay | Admitting: Internal Medicine

## 2014-12-24 ENCOUNTER — Other Ambulatory Visit: Payer: Self-pay | Admitting: Internal Medicine

## 2014-12-24 NOTE — Telephone Encounter (Signed)
Called refill into pharmacy spoke with pharmacist Joe gave md response...Amy Tucker

## 2014-12-24 NOTE — Telephone Encounter (Signed)
OK X1 

## 2015-01-01 ENCOUNTER — Other Ambulatory Visit: Payer: Self-pay | Admitting: Internal Medicine

## 2015-01-07 ENCOUNTER — Other Ambulatory Visit: Payer: Self-pay | Admitting: Internal Medicine

## 2015-01-07 ENCOUNTER — Other Ambulatory Visit: Payer: Self-pay | Admitting: Emergency Medicine

## 2015-01-07 ENCOUNTER — Encounter: Payer: Self-pay | Admitting: Internal Medicine

## 2015-01-07 ENCOUNTER — Other Ambulatory Visit (INDEPENDENT_AMBULATORY_CARE_PROVIDER_SITE_OTHER): Payer: Commercial Managed Care - HMO

## 2015-01-07 DIAGNOSIS — E038 Other specified hypothyroidism: Secondary | ICD-10-CM | POA: Diagnosis not present

## 2015-01-07 LAB — TSH: TSH: 2.62 u[IU]/mL (ref 0.35–4.50)

## 2015-01-07 MED ORDER — LEVOTHYROXINE SODIUM 75 MCG PO TABS: ORAL_TABLET | ORAL | Status: DC

## 2015-01-08 ENCOUNTER — Telehealth: Payer: Self-pay

## 2015-01-08 NOTE — Telephone Encounter (Signed)
Call to introduce AWV; Amy Tucker stated she is fairly health and does not really feel she wants the AWV but does need to establish care with another physician; Discussed options and agreed to schedule with Dr. Forde Dandy on 1/20 at 9am;  Discussed o/d health screens, but states she does not get the vaccines but will agree to the colo guard and will discuss with the doctor at her apt.

## 2015-01-11 ENCOUNTER — Ambulatory Visit: Payer: Medicare PPO | Admitting: Internal Medicine

## 2015-02-12 ENCOUNTER — Encounter: Payer: Self-pay | Admitting: Internal Medicine

## 2015-02-12 ENCOUNTER — Ambulatory Visit (INDEPENDENT_AMBULATORY_CARE_PROVIDER_SITE_OTHER): Payer: Commercial Managed Care - HMO | Admitting: Internal Medicine

## 2015-02-12 VITALS — BP 144/92 | HR 74 | Temp 98.4°F | Resp 16 | Wt 136.0 lb

## 2015-02-12 DIAGNOSIS — K219 Gastro-esophageal reflux disease without esophagitis: Secondary | ICD-10-CM | POA: Diagnosis not present

## 2015-02-12 DIAGNOSIS — F419 Anxiety disorder, unspecified: Secondary | ICD-10-CM | POA: Insufficient documentation

## 2015-02-12 DIAGNOSIS — E038 Other specified hypothyroidism: Secondary | ICD-10-CM | POA: Diagnosis not present

## 2015-02-12 DIAGNOSIS — E785 Hyperlipidemia, unspecified: Secondary | ICD-10-CM | POA: Diagnosis not present

## 2015-02-12 DIAGNOSIS — J0100 Acute maxillary sinusitis, unspecified: Secondary | ICD-10-CM

## 2015-02-12 MED ORDER — AMOXICILLIN-POT CLAVULANATE 875-125 MG PO TABS: 1.0000 | ORAL_TABLET | Freq: Two times a day (BID) | ORAL | Status: DC

## 2015-02-12 NOTE — Assessment & Plan Note (Signed)
Has anxiety occasionally and uses Xanax as needed Continue as needed Xanax

## 2015-02-12 NOTE — Progress Notes (Signed)
Subjective:    Patient ID: Amy Tucker, female    DOB: 08-Feb-1945, 70 y.o.   MRN: [id number]  HPI She is here to establish with a new pcp.    Hyperlipidemia: She is taking her medication daily. She is compliant with a low fat/cholesterol diet. She is exercising regularly, walking regularly. She denies myalgias.   Hypothyroidism:  She is taking her medication daily.  She denies any recent changes in energy or weight that are unexplained.   GERD:  She is taking her medication as needed only.  She denies any GERD symptoms and feels her GERD is well controlled.   Anxiety:  Takes occasional xanax. She is not taking very often, but likes knowing it is there if she needs it.    Sinus infection:  Started one week ago.  She as alternating congesion and pain in her sinuses.  She been taking mucinex ER.  She has also taken tylenol.  Yesterday her nasal mucus was discolored and bloody today.  She has PND.  She denies cough, except at night with laying down.  She had a low grade fever two nights.  She has not had many sinus infections since she quit smoking.  The past couple of days she has felt better. She was concerned that the Mucinex was elevated blood pressure, which is typically well controlled.  Medications and allergies reviewed with patient and updated if appropriate.  Patient Active Problem List   Diagnosis Date Noted  . GERD without esophagitis 10/27/2013  . PAROXYSMAL SUPRAVENTRICULAR TACHYCARDIA 10/27/2009  . Headache(784.0) 03/05/2009  . Hyperlipidemia 08/02/2007  . Osteopenia 08/02/2007  . Hypothyroidism 07/31/2006  . Hyperglycemia 07/31/2006    Current Outpatient Prescriptions on File Prior to Visit  Medication Sig Dispense Refill  . ALPRAZolam (XANAX) 0.25 MG tablet TAKE ONE-HALF TABLET BY MOUTH EVERY 8 HOURS AS NEEDED *PATIENT WILL NEED FOLLOW UP IN THE NEXT 60 DAYS* 30 tablet 0  . Calcium Carbonate-Vitamin D (CALCIUM + D PO) Take by mouth daily.      . Cholecalciferol  (VITAMIN D3) 1000 UNITS CAPS Take by mouth daily.      . Multiple Vitamin (MULTIVITAMIN) tablet Take 1 tablet by mouth daily.      [name] omeprazole (PRILOSEC OTC) 20 MG tablet Take 20 mg by mouth daily as needed.     . pravastatin (PRAVACHOL) 40 MG tablet Take 1 tablet (40 mg total) by mouth daily. 90 tablet 1  . SYNTHROID 75 MCG tablet TAKE ONE TABLET BY MOUTH ONCE DAILY EXCEPT  ON  WEDNESDAYS  TAKE  ONE  AND  ONE  HALF  TABLETS 96 tablet 1  . vitamin C (ASCORBIC ACID) 500 MG tablet Take 500 mg by mouth daily.       No current facility-administered medications on file prior to visit.    Past Medical History  Diagnosis Date  . Osteopenia     BMD as per Dr Molli Posey, [place]  . Other and unspecified hyperlipidemia     NMR lipoprofile 2004: LDL 169 (2322/1419), TG 130, LDL goal =<100  . Unspecified hypothyroidism   . Other abnormal glucose     elevated FBS    Past Surgical History  Procedure Laterality Date  . Dilation and curettage of uterus    . Tonsillectomy and adenoidectomy    . External fixation ankle fracture  1997    post fall (no surgery)  . No colonoscopy      "I don't want to be put  to sleep" [facility] reviewed)    Social History   Social History  . Marital Status: Married    Spouse Name: N/A  . Number of Children: N/A  . Years of Education: N/A   Occupational History  . auditor    Social History Main Topics  . Smoking status: Former Smoker -- 2.50 packs/day for 20 years    Types: Cigarettes    Quit date: 01/23/1990  . Smokeless tobacco: Never Used     Comment: pt smoked from age [age]  (51- 1992),up to 2 ppd  . Alcohol Use: Yes     Comment: Rarely  . Drug Use: No  . Sexual Activity: Not Asked   Other Topics Concern  . None   Social History Narrative   Regular exercise- no   Low fat, carb          Family History  Problem Relation Age of Onset  . Thyroid disease Maternal Aunt     goiter  . Diabetes      8 M uncles & M aunts  . Leukemia Father     . Diabetes Mother   . Hypertension Mother   . Coronary artery disease Mother   . Heart attack Mother 17  . Stroke Mother     > 29  . Rheum arthritis Mother   . Kidney failure Mother   . Hypertension Brother   . Other Paternal Uncle   . Alzheimer's disease Paternal Aunt   . Cirrhosis Paternal Uncle   . Heart attack Maternal Uncle 55    Review of Systems  Constitutional: Positive for fever.  HENT: Positive for congestion, postnasal drip, sinus pressure and sore throat (from PND). Negative for ear pain.   Respiratory: Positive for cough. Negative for shortness of breath and wheezing.   Cardiovascular: Negative for chest pain, palpitations and leg swelling.  Gastrointestinal: Positive for nausea (mild this morning).  Neurological: Positive for headaches. Negative for dizziness and light-headedness.       Objective:   Filed Vitals:   02/12/15 0856  BP: 144/92  Pulse: 74  Temp: 98.4 F (36.9 C)  Resp: 16   Filed Weights   02/12/15 0856  Weight: 136 lb (61.689 kg)   Body mass index is 22.99 kg/(m^2).   Physical Exam GENERAL APPEARANCE: Appears stated age, well appearing, NAD EYES: conjunctiva clear, no icterus HEENT: bilateral tympanic membranes and ear canals normal, oropharynx with no erythema, no thyromegaly, trachea midline, no cervical or supraclavicular lymphadenopathy LUNGS: Clear to auscultation without wheeze or crackles, unlabored breathing, good air entry bilaterally HEART: Normal S1,S2 without murmurs EXTREMITIES: Without clubbing, cyanosis, or edema      Assessment & Plan:   Sinus infection Her symptoms are improving and I have a feeling this is a viral infection Continue cold medications, rest and fluids I will give her a written prescription for Augmentin to use only if she needs to, but we both feel it is best to avoid antibiotics  Elevated blood pressure today Typically well controlled and may be related to her infection or cold medications She  will monitor  See Problem List for Assessment and Plan of chronic medical problems.  Follow-up annually for physical

## 2015-02-12 NOTE — Patient Instructions (Signed)
Your antibiotic prescription has been given to you to use only if you need it.  Hopefully you can avoid it.    If your symptoms worsen or fail to improve, please contact our office for further instruction, or in case of emergency go directly to the emergency room at the closest medical facility.   General Recommendations:    Please drink plenty of fluids.  Get plenty of rest   Sleep in humidified air  Use saline nasal sprays  Netti pot  OTC Medications:  Decongestants - helps relieve congestion   Flonase (generic fluticasone) or Nasacort (generic triamcinolone) - please make sure to use the "cross-over" technique at a 45 degree angle towards the opposite eye as opposed to straight up the nasal passageway.   Sudafed (generic pseudoephedrine - Note this is the one that is available behind the pharmacy counter); Products with phenylephrine (-PE) may also be used but is often not as effective as pseudoephedrine.   If you have HIGH BLOOD PRESSURE - Coricidin HBP; AVOID any product that is -D as this contains pseudoephedrine which may increase your blood pressure.  Afrin (oxymetazoline) every 6-8 hours for up to 3 days.  Allergies - helps relieve runny nose, itchy eyes and sneezing   Claritin (generic loratidine), Allegra (fexofenidine), or Zyrtec (generic cyrterizine) for runny nose. These medications should not cause drowsiness.  Note - Benadryl (generic diphenhydramine) may be used however may cause drowsiness  Cough -   Delsym or Robitussin (generic dextromethorphan)  Expectorants - helps loosen mucus to ease removal   Mucinex (generic guaifenesin) as directed on the package.  Headaches / General Aches   Tylenol (generic acetaminophen) - DO NOT EXCEED 3 grams (3,000 mg) in a 24 hour time period  Advil/Motrin (generic ibuprofen)  Sore Throat -   Salt water gargle   Chloraseptic (generic benzocaine) spray or lozenges / Sucrets (generic  dyclonine)

## 2015-02-12 NOTE — Assessment & Plan Note (Signed)
Last TSH in normal range Continue current dose of Synthroid

## 2015-02-12 NOTE — Assessment & Plan Note (Signed)
Taking pravastatin daily and tolerating it well Check lipid panel annually Continue regular exercise

## 2015-02-12 NOTE — Progress Notes (Signed)
Pre visit review using our clinic review tool, if applicable. No additional management support is needed unless otherwise documented below in the visit note. 

## 2015-02-12 NOTE — Assessment & Plan Note (Signed)
Taking omeprazole only as needed Overall controlled-continue as needed medication

## 2015-05-14 ENCOUNTER — Other Ambulatory Visit: Payer: Self-pay | Admitting: Internal Medicine

## 2015-05-14 NOTE — Addendum Note (Signed)
Addended by: Binnie Rail on: 05/14/2015 11:56 AM   Modules accepted: Orders, Medications

## 2015-05-14 NOTE — Telephone Encounter (Signed)
We do not refill antibiotics - if she thinks she is sick and requires an antibiotic she needs to be evaluated.

## 2015-05-14 NOTE — Telephone Encounter (Signed)
Please advise 

## 2015-05-22 ENCOUNTER — Ambulatory Visit (INDEPENDENT_AMBULATORY_CARE_PROVIDER_SITE_OTHER): Payer: Commercial Managed Care - HMO | Admitting: Physician Assistant

## 2015-05-22 VITALS — BP 118/78 | HR 84 | Temp 100.1°F | Resp 16 | Ht 65.5 in | Wt 136.0 lb

## 2015-05-22 DIAGNOSIS — R509 Fever, unspecified: Secondary | ICD-10-CM | POA: Diagnosis not present

## 2015-05-22 DIAGNOSIS — J209 Acute bronchitis, unspecified: Secondary | ICD-10-CM | POA: Diagnosis not present

## 2015-05-22 LAB — POCT CBC
Granulocyte percent: 83.4 %G — AB (ref 37–80)
HEMATOCRIT: 40.1 % (ref 37.7–47.9)
Hemoglobin: 14.4 g/dL (ref 12.2–16.2)
LYMPH, POC: 1 (ref 0.6–3.4)
MCH, POC: 31.3 pg — AB (ref 27–31.2)
MCHC: 36 g/dL — AB (ref 31.8–35.4)
MCV: 86.9 fL (ref 80–97)
MID (CBC): 0.6 (ref 0–0.9)
MPV: 8.8 fL (ref 0–99.8)
POC GRANULOCYTE: 8.4 — AB (ref 2–6.9)
POC LYMPH %: 10.2 % (ref 10–50)
POC MID %: 6.4 % (ref 0–12)
Platelet Count, POC: 203 10*3/uL (ref 142–424)
RBC: 4.61 M/uL (ref 4.04–5.48)
RDW, POC: 13.5 %
WBC: 10.1 10*3/uL (ref 4.6–10.2)

## 2015-05-22 LAB — POCT INFLUENZA A/B
INFLUENZA A, POC: NEGATIVE
INFLUENZA B, POC: NEGATIVE

## 2015-05-22 MED ORDER — AZITHROMYCIN 250 MG PO TABS: ORAL_TABLET | ORAL | Status: AC

## 2015-05-22 MED ORDER — HYDROCOD POLST-CPM POLST ER 10-8 MG/5ML PO SUER: 5.0000 mL | Freq: Two times a day (BID) | ORAL | Status: DC | PRN

## 2015-05-22 NOTE — Patient Instructions (Addendum)
Drink plenty of water (64 oz/day) and get plenty of rest. If you have been prescribed a cough syrup, do not drive or operate heavy machinery while using this medication. Take zpak as directed May take mucinex during the day with lots of water. If your symptoms are not improving in 1 week or at any time if symptoms worsen, return to clinic.     IF you received an x-ray today, you will receive an invoice from [facility]. Please contact [facility] at [phone] with questions or concerns regarding your invoice.   IF you received labwork today, you will receive an invoice from [employer]. Please contact [facility] at [phone] with questions or concerns regarding your invoice.   Our billing staff will not be able to assist you with questions regarding bills from these companies.  You will be contacted with the lab results as soon as they are available. The fastest way to get your results is to activate your My Chart account. Instructions are located on the last page of this paperwork. If you have not heard from [place] regarding the results in 2 weeks, please contact this office.

## 2015-05-22 NOTE — Progress Notes (Signed)
[facility] [address] [place] [address] [phone]  Date:  05/22/2015   Name:  Amy Tucker   DOB:  1945/04/12   MRN:  [id number]  PCP:  Binnie Rail, MD    Chief Complaint: Sore Throat and Cough   History of Present Illness:  This is a 70 y.o. female with PMH paroxysmal SVT, HLD, anxiety, GERD, osteopenia, hypothyroidism who is presenting with cough and sore throat x 3-4 days. Cough is productive of yellow sputum. Nasal drainage is clear. States throat is very sore, hurts to talk. Bilateral ears are sore. Started running a fever yesterday -- highest was 100.8. Here temp is 100.1. Has been taking tylenol but hasn't taken yet this morning. Took delsym yesterday but made her feel unable to cough anything up so stopped. Has also tried coricidin but hasn't helped.  SOB/wheezing: no History of asthma: no History of env allergies: no Tobacco use: former smoker, quit 55 Husband with same illness before her. He is starting to get a little better now.   Review of Systems:  Review of Systems See HPI  Patient Active Problem List   Diagnosis Date Noted  . Anxiety 02/12/2015  . GERD without esophagitis 10/27/2013  . PAROXYSMAL SUPRAVENTRICULAR TACHYCARDIA 10/27/2009  . Headache(784.0) 03/05/2009  . Hyperlipidemia 08/02/2007  . Osteopenia 08/02/2007  . Hypothyroidism 07/31/2006  . Hyperglycemia 07/31/2006    Prior to Admission medications   Medication Sig Start Date End Date Taking? Authorizing Provider  ALPRAZolam (XANAX) 0.25 MG tablet TAKE ONE-HALF TABLET BY MOUTH EVERY 8 HOURS AS NEEDED *PATIENT WILL NEED FOLLOW UP IN THE NEXT 60 DAYS* 12/24/14  Yes Hendricks Limes, MD  Calcium Carbonate-Vitamin D (CALCIUM + D PO) Take by mouth daily.     Yes Historical Provider, MD  Cholecalciferol (VITAMIN D3) 1000 UNITS CAPS Take by mouth daily.     Yes Historical Provider, MD  Multiple Vitamin (MULTIVITAMIN) tablet Take 1 tablet by mouth daily.     Yes  Historical Provider, MD  Omega-3 Fatty Acids (FISH OIL PO) Take by mouth.   Yes Historical Provider, MD  omeprazole (PRILOSEC OTC) 20 MG tablet Take 20 mg by mouth daily as needed.    Yes Historical Provider, MD  pravastatin (PRAVACHOL) 40 MG tablet Take 1 tablet (40 mg total) by mouth daily. 01/01/15  Yes Hendricks Limes, MD  SYNTHROID 75 MCG tablet TAKE ONE TABLET BY MOUTH ONCE DAILY EXCEPT  ON  [facility]  TAKE  ONE  AND  ONE  HALF  TABLETS 01/07/15  Yes Hendricks Limes, MD  vitamin C (ASCORBIC ACID) 500 MG tablet Take 500 mg by mouth daily.     Yes Historical Provider, MD    Allergies  Allergen Reactions  . Ciprofloxacin     Dizziness, nausea, headache  . Meloxicam     REACTION: GI intolerance  . Tramadol Hcl     REACTION: dizziness  . Zocor [Simvastatin]     D/Ced 01/13 due to muscle weakness    Past Surgical History  Procedure Laterality Date  . Dilation and curettage of uterus    . Tonsillectomy and adenoidectomy    . External fixation ankle fracture  1997    post fall (no surgery)  . No colonoscopy      "I don't want to be put to sleep" [facility] reviewed)    Social History  Substance Use Topics  . Smoking status: Former Smoker -- 2.50 packs/day for 20 years  Types: Cigarettes    Quit date: 01/23/1990  . Smokeless tobacco: Never Used     Comment: pt smoked from age [age]  (61- 1992),up to 2 ppd  . Alcohol Use: Yes     Comment: Rarely    Family History  Problem Relation Age of Onset  . Thyroid disease Maternal Aunt     goiter  . Diabetes      66 M uncles & M aunts  . Leukemia Father   . Diabetes Mother   . Hypertension Mother   . Coronary artery disease Mother   . Heart attack Mother 66  . Stroke Mother     > 24  . Rheum arthritis Mother   . Kidney failure Mother   . Hypertension Brother   . Other Paternal Uncle   . Alzheimer's disease Paternal Aunt   . Cirrhosis Paternal Uncle   . Heart attack Maternal Uncle 55    Medication list has been reviewed  and updated.  Physical Examination:  Physical Exam  Constitutional: She is oriented to person, place, and time. She appears well-developed and well-nourished. No distress.  HENT:  Head: Normocephalic and atraumatic.  Right Ear: Hearing, tympanic membrane, external ear and ear canal normal.  Left Ear: Hearing, tympanic membrane, external ear and ear canal normal.  Nose: Nose normal.  Mouth/Throat: Uvula is midline and mucous membranes are normal. Posterior oropharyngeal erythema present. No oropharyngeal exudate, posterior oropharyngeal edema or tonsillar abscesses.  Eyes: Conjunctivae and lids are normal. Right eye exhibits no discharge. Left eye exhibits no discharge. No scleral icterus.  Cardiovascular: Normal rate, regular rhythm, normal heart sounds and normal pulses.   No murmur heard. Pulmonary/Chest: Effort normal and breath sounds normal. No respiratory distress. She has no wheezes. She has no rhonchi. She has no rales.  Musculoskeletal: Normal range of motion.  Lymphadenopathy:       Head (right side): No submental, no submandibular and no tonsillar adenopathy present.       Head (left side): No submental, no submandibular and no tonsillar adenopathy present.    She has no cervical adenopathy.  Neurological: She is alert and oriented to person, place, and time.  Skin: Skin is warm, dry and intact. No lesion and no rash noted.  Psychiatric: She has a normal mood and affect. Her speech is normal and behavior is normal. Thought content normal.   BP 118/78 mmHg  Pulse 84  Temp(Src) 100.1 F (37.8 C) (Oral)  Resp 16  Ht 5' 5.5" (1.664 m)  Wt 136 lb (61.689 kg)  BMI 22.28 kg/m2  SpO2 98%  Results for orders placed or performed in visit on 05/22/15  POCT Influenza A/B  Result Value Ref Range   Influenza A, POC Negative Negative   Influenza B, POC Negative Negative  POCT CBC  Result Value Ref Range   WBC 10.1 4.6 - 10.2 K/uL   Lymph, poc 1.0 0.6 - 3.4   POC LYMPH PERCENT  10.2 10 - 50 %L   MID (cbc) 0.6 0 - 0.9   POC MID % 6.4 0 - 12 %M   POC Granulocyte 8.4 (A) 2 - 6.9   Granulocyte percent 83.4 (A) 37 - 80 %G   RBC 4.61 4.04 - 5.48 M/uL   Hemoglobin 14.4 12.2 - 16.2 g/dL   HCT, POC 40.1 37.7 - 47.9 %   MCV 86.9 80 - 97 fL   MCH, POC 31.3 (A) 27 - 31.2 pg   MCHC 36.0 (A) 31.8 -  35.4 g/dL   RDW, POC 13.5 %   Platelet Count, POC 203 142 - 424 K/uL   MPV 8.8 0 - 99.8 fL    Assessment and Plan:  1. Fever, unspecified 2. Acute bronchitis Influenza negative, CBC indicative of possible bacterial process. Treat with zpak, tussionex, mucinex. Return in 1 week if symptoms do not improve or at any time if symptoms worsen.  - POCT Influenza A/B - POCT CBC - azithromycin (ZITHROMAX) 250 MG tablet; Take 2 tabs PO x 1 dose, then 1 tab PO QD x 4 days  Dispense: 6 tablet; Refill: 0 - chlorpheniramine-HYDROcodone (TUSSIONEX PENNKINETIC ER) 10-8 MG/5ML SUER; Take 5 mLs by mouth every 12 (twelve) hours as needed for cough.  Dispense: 100 mL; Refill: 0   Benjaman Pott. Drenda Freeze, MHS [facility]  05/22/2015

## 2015-06-29 ENCOUNTER — Other Ambulatory Visit: Payer: Self-pay | Admitting: Internal Medicine

## 2015-07-21 ENCOUNTER — Encounter: Payer: Self-pay | Admitting: Internal Medicine

## 2015-07-21 DIAGNOSIS — Z1283 Encounter for screening for malignant neoplasm of skin: Secondary | ICD-10-CM

## 2015-08-11 ENCOUNTER — Encounter: Payer: Self-pay | Admitting: Internal Medicine

## 2015-08-12 MED ORDER — ALPRAZOLAM 0.25 MG PO TABS: ORAL_TABLET | ORAL | Status: DC

## 2015-08-12 NOTE — Telephone Encounter (Signed)
rx printed

## 2015-08-16 ENCOUNTER — Other Ambulatory Visit: Payer: Self-pay | Admitting: Internal Medicine

## 2015-08-16 MED ORDER — ALPRAZOLAM 0.25 MG PO TABS: ORAL_TABLET | ORAL | 0 refills | Status: DC

## 2015-08-16 NOTE — Progress Notes (Signed)
rx printed

## 2015-08-24 ENCOUNTER — Telehealth: Payer: Self-pay | Admitting: *Deleted

## 2015-08-24 NOTE — Telephone Encounter (Signed)
Rec'd fax pt is requesting refill on her Alprazolam 0.25 mg. Last filled 12/24/14...Amy Tucker

## 2015-08-24 NOTE — Telephone Encounter (Signed)
looks like this was just done 7/24 - ?

## 2015-08-24 NOTE — Telephone Encounter (Signed)
Called [facility] spoke with Lakesha/technician verified if they received fax from 7/24 on pt alprazolam. She states yes & pt pick up already. Voided this request from this am.../lmb

## 2015-11-09 NOTE — Progress Notes (Signed)
Subjective:    Patient ID: Amy Tucker, female    DOB: Sep 17, 1945, 70 y.o.   MRN: [id number]  HPI Here for medicare wellness exam and a annual physical exam.   For the past 6 months she has had a mark stomach.  It does not itch or pain.  It is more prominent at times.  She sleeps with a heating pad nightly.    I have personally reviewed and have noted 1.The patient's medical and social history 2.Their use of alcohol, tobacco or illicit drugs 3.Their current medications and supplements 4.The patient's functional ability including ADL's, fall risks, home safety risks and hearing or visual impairment. 5.Diet and physical activities 6.Evidence for depression or mood disorders 7.Care team reviewed - none   Are there smokers in your home (other than you)? No  Risk Factors Exercise: walks dog twice a day - total of 45 minutes Dietary issues discussed: eats small meals, does not typically eat after 6, eats lot os veges/fruits.  Does not eat much red meat.  Likes sweets.  Cardiac risk factors: advanced age, hyperlipidemia   Depression Screen  Have you felt down, depressed or hopeless? No  Have you felt little interest or pleasure in doing things?  No  Activities of Daily Living In your present state of health, do you have any difficulty performing the following activities?:  Driving? No Managing money?  No Feeding yourself? No Getting from bed to chair? No Climbing a flight of stairs? No Preparing food and eating?: No Bathing or showering? No Getting dressed: No Getting to/using the toilet? No Moving around from place to place: No In the past year have you fallen or had a near fall?: No   Are you sexually active?  yes  Do you have more than one partner?  no  Hearing Difficulties:  Do you often ask people to speak up or repeat themselves? yes Do you experience ringing or noises in your ears? Yes, left ear Do  you have difficulty understanding soft or whispered voices? yes Vision:              Any change in vision:  no             Up to date with eye exam:   yes Memory:  Do you feel that you have a problem with memory? No  Do you often misplace items? No  Do you feel safe at home?  Yes  Cognitive Testing  Alert, Orientated? Yes  Normal Appearance? Yes  Recall of three objects?  Yes  Can perform simple calculations? Yes  Displays appropriate judgment? Yes  Can read the correct time from a watch face? Yes   Advanced Directives have been discussed with the patient? Yes   Medications and allergies reviewed with patient and updated if appropriate.  Patient Active Problem List   Diagnosis Date Noted  . Anxiety 02/12/2015  . GERD without esophagitis 10/27/2013  . PAROXYSMAL SUPRAVENTRICULAR TACHYCARDIA 10/27/2009  . Headache(784.0) 03/05/2009  . Hyperlipidemia 08/02/2007  . Osteopenia 08/02/2007  . Hypothyroidism 07/31/2006  . Hyperglycemia 07/31/2006    Current Outpatient Prescriptions on File Prior to Visit  Medication Sig Dispense Refill  . ALPRAZolam (XANAX) 0.25 MG tablet TAKE ONE-HALF TABLET BY MOUTH EVERY 8 HOURS AS NEEDED 30 tablet 0  . Calcium Carbonate-Vitamin D (CALCIUM + D PO) Take by mouth daily.      . Cholecalciferol (VITAMIN D3) 1000 UNITS CAPS Take by mouth daily.      [name]  Multiple Vitamin (MULTIVITAMIN) tablet Take 1 tablet by mouth daily.      . Omega-3 Fatty Acids (FISH OIL PO) Take by mouth.    [name] omeprazole (PRILOSEC OTC) 20 MG tablet Take 20 mg by mouth daily as needed.     . pravastatin (PRAVACHOL) 40 MG tablet TAKE ONE TABLET BY MOUTH ONCE DAILY 90 tablet 2  . SYNTHROID 75 MCG tablet TAKE 1 TABLET BY MOUTH DAILY EXCEPT ON WEDNESDAYS TAKE ONE AND ONE-HALF TABLETS BY MOUTH 96 tablet 2  . vitamin C (ASCORBIC ACID) 500 MG tablet Take 500 mg by mouth daily.       No current facility-administered medications on file prior to visit.     Past Medical History:    Diagnosis Date  . Osteopenia    BMD as per Dr Molli Posey, [place]  . Other abnormal glucose    elevated FBS  . Other and unspecified hyperlipidemia    NMR lipoprofile 2004: LDL 169 (2322/1419), TG 130, LDL goal =<100  . Unspecified hypothyroidism     Past Surgical History:  Procedure Laterality Date  . DILATION AND CURETTAGE OF UTERUS    . EXTERNAL FIXATION ANKLE FRACTURE  1997   post fall (no surgery)  . no colonoscopy     "I don't want to be put to sleep" [facility] reviewed)  . TONSILLECTOMY AND ADENOIDECTOMY      Social History   Social History  . Marital status: Married    Spouse name: N/A  . Number of children: N/A  . Years of education: N/A   Occupational History  . auditor [employer]   Social History Main Topics  . Smoking status: Former Smoker    Packs/day: 2.50    Years: 20.00    Types: Cigarettes    Quit date: 01/23/1990  . Smokeless tobacco: Never Used     Comment: pt smoked from age [age]  (80- 1992),up to 2 ppd  . Alcohol use Yes     Comment: Rarely  . Drug use: No  . Sexual activity: Not Asked   Other Topics Concern  . None   Social History Narrative   Regular exercise- no   Low fat, carb          Family History  Problem Relation Age of Onset  . Thyroid disease Maternal Aunt     goiter  . Diabetes      40 M uncles & M aunts  . Leukemia Father   . Diabetes Mother   . Hypertension Mother   . Coronary artery disease Mother   . Heart attack Mother 23  . Stroke Mother     > 78  . Rheum arthritis Mother   . Kidney failure Mother   . Hypertension Brother   . Other Paternal Uncle   . Alzheimer's disease Paternal Aunt   . Cirrhosis Paternal Uncle   . Heart attack Maternal Uncle 55    Review of Systems  Constitutional: Negative for chills and fever.  HENT: Positive for hearing loss and tinnitus (left ear).   Eyes: Negative for visual disturbance.  Respiratory: Negative for cough, shortness of breath and wheezing.    Cardiovascular: Negative for chest pain, palpitations and leg swelling.  Gastrointestinal: Negative for abdominal pain, blood in stool, constipation, diarrhea and nausea.       Rare GERD  Endocrine: Negative for polydipsia and polyuria.  Genitourinary: Positive for frequency. Negative for dysuria and hematuria.  Musculoskeletal: Negative for arthralgias, back pain and myalgias.  Skin: Positive for color change (stomach change). Negative for rash.  Neurological: Positive for headaches (weather related, allergy related). Negative for dizziness and light-headedness.  Psychiatric/Behavioral: Negative for dysphoric mood. The patient is nervous/anxious.        Objective:   Vitals:   11/10/15 0800  BP: 134/88  Pulse: 76  Resp: 16  Temp: 97.7 F (36.5 C)   Filed Weights   11/10/15 0800  Weight: 138 lb (62.6 kg)   Body mass index is 22.27 kg/m.   Physical Exam Constitutional: She appears well-developed and well-nourished. No distress.  HENT:  Head: Normocephalic and atraumatic.  Right Ear: External ear normal. Normal ear canal and TM Left Ear: External ear normal.  Normal ear canal and TM Mouth/Throat: Oropharynx is clear and moist.  Eyes: Conjunctivae and EOM are normal.  Neck: Neck supple. No tracheal deviation present. No thyromegaly present.  No carotid bruit  Cardiovascular: Normal rate, regular rhythm and normal heart sounds.   No murmur heard.  No edema. Pulmonary/Chest: Effort normal and breath sounds normal. No respiratory distress. She has no wheezes. She has no rales.  Breast: deferred to Gyn Abdominal: Soft. She exhibits no distension. There is no tenderness.  Lymphadenopathy: She has no cervical adenopathy.  Skin: Skin is warm and dry. She is not diaphoretic.  Psychiatric: She has a normal mood and affect. Her behavior is normal.         Assessment & Plan:   Wellness Exam: Immunizations - deferred flu, shingles and pneumonia vaccines Colonoscopy -never  had one -- does not want one - will consider cologuard Mammogram - due  -- will schedule Dexa - ordered Gyn - no longer sees gyn Eye exam  Up to date  Hearing loss  Yes, deferred evaluation Memory concerns/difficulties  -none Independent of ADLs  -fully Stressed the importance of regular exercise   Patient received copy of preventative screening tests/immunizations recommended for the next 5-10 years.  Physical exam: Screening blood work ordered Immunizations  - deferred flu, shingles and pneumonia vaccines Colonoscopy - never had - declined, will consider cologuard Mammogram - due -- will schedule Gyn - no longer seeing Dexa - ordered Eye exams  Up to date  Exercise - walks daily Weight - normal BMI Skin  - no concerning spots - will sees derm next year Substance abuse -- none  See Problem List for Assessment and Plan of chronic medical problems.

## 2015-11-09 NOTE — Assessment & Plan Note (Signed)
Check a1c 

## 2015-11-09 NOTE — Assessment & Plan Note (Signed)
Check tsh  Titrate med dose if needed  

## 2015-11-09 NOTE — Patient Instructions (Addendum)
Amy Tucker , Thank you for taking time to come for your Medicare Wellness Visit. I appreciate your ongoing commitment to your health goals. Please review the following plan we discussed and let me know if I can assist you in the future.   These are the goals we discussed: Goals    Call insurance to see if cologuard is covered, schedule mammo      This is a list of the screening recommended for you and due dates:  Health Maintenance  Topic Date Due  .  Hepatitis C: One time screening is recommended by Center for Disease Control  (CDC) for  adults born from [birth year] through 1965.   03-25-1945  . Colon Cancer Screening  03/02/1995  . Shingles Vaccine  03/01/2005  . DEXA scan (bone density measurement)  03/01/2010  . Pneumonia vaccines (1 of 2 - PCV13) 03/01/2010  . Mammogram  09/13/2014  . Flu Shot  08/24/2015  . Tetanus Vaccine  06/22/2023     Test(s) ordered today. Your results will be released to [place] (or called to you) after review, usually within 72hours after test completion. If any changes need to be made, you will be notified at that same time.  All other Health Maintenance issues reviewed.   All recommended immunizations and age-appropriate screenings are up-to-date or discussed.  No immunizations administered today.   Medications reviewed and updated.   No changes recommended at this time.  Your prescription(s) have Tucker submitted to your pharmacy. Please take as directed and contact our office if you believe you are having problem(s) with the medication(s).  A dexa was ordered  Please followup in one year   Health Maintenance, Female Adopting a healthy lifestyle and getting preventive care can go a long way to promote health and wellness. Talk with your health care provider about what schedule of regular examinations is right for you. This is a good chance for you to check in with your provider about disease prevention and staying healthy. In between checkups, there  are plenty of things you can do on your own. Experts have done a lot of research about which lifestyle changes and preventive measures are most likely to keep you healthy. Ask your health care provider for more information. WEIGHT AND DIET  Eat a healthy diet  Be sure to include plenty of vegetables, fruits, low-fat dairy products, and lean protein.  Do not eat a lot of foods high in solid fats, added sugars, or salt.  Get regular exercise. This is one of the most important things you can do for your health.  Most adults should exercise for at least 150 minutes each week. The exercise should increase your heart rate and make you sweat (moderate-intensity exercise).  Most adults should also do strengthening exercises at least twice a week. This is in addition to the moderate-intensity exercise.  Maintain a healthy weight  Body mass index (BMI) is a measurement that can be used to identify possible weight problems. It estimates body fat based on height and weight. Your health care provider can help determine your BMI and help you achieve or maintain a healthy weight.  For females 25 years of age and older:   A BMI below 18.5 is considered underweight.  A BMI of 18.5 to 24.9 is normal.  A BMI of 25 to 29.9 is considered overweight.  A BMI of 30 and above is considered obese.  Watch levels of cholesterol and blood lipids  You should start having your blood  tested for lipids and cholesterol at 70 years of age, then have this test every 5 years.  You may need to have your cholesterol levels checked more often if:  Your lipid or cholesterol levels are high.  You are older than 70 years of age.  You are at high risk for heart disease.  CANCER SCREENING   Lung Cancer  Lung cancer screening is recommended for adults 58-78 years old who are at high risk for lung cancer because of a history of smoking.  A yearly low-dose CT scan of the lungs is recommended for people  who:  Currently smoke.  Have quit within the past 15 years.  Have at least a 30-pack-year history of smoking. A pack year is smoking an average of one pack of cigarettes a day for 1 year.  Yearly screening should continue until it has Tucker 15 years since you quit.  Yearly screening should stop if you develop a health problem that would prevent you from having lung cancer treatment.  Breast Cancer  Practice breast self-awareness. This means understanding how your breasts normally appear and feel.  It also means doing regular breast self-exams. Let your health care provider know about any changes, no matter how small.  If you are in your 20s or 30s, you should have a clinical breast exam (CBE) by a health care provider every 1-3 years as part of a regular health exam.  If you are 29 or older, have a CBE every year. Also consider having a breast X-ray (mammogram) every year.  If you have a family history of breast cancer, talk to your health care provider about genetic screening.  If you are at high risk for breast cancer, talk to your health care provider about having an MRI and a mammogram every year.  Breast cancer gene (BRCA) assessment is recommended for women who have family members with BRCA-related cancers. BRCA-related cancers include:  Breast.  Ovarian.  Tubal.  Peritoneal cancers.  Results of the assessment will determine the need for genetic counseling and BRCA1 and BRCA2 testing. Cervical Cancer Your health care provider may recommend that you be screened regularly for cancer of the pelvic organs (ovaries, uterus, and vagina). This screening involves a pelvic examination, including checking for microscopic changes to the surface of your cervix (Pap test). You may be encouraged to have this screening done every 3 years, beginning at age 69.  For women ages 16-65, health care providers may recommend pelvic exams and Pap testing every 3 years, or they may recommend the  Pap and pelvic exam, combined with testing for human papilloma virus (HPV), every 5 years. Some types of HPV increase your risk of cervical cancer. Testing for HPV may also be done on women of any age with unclear Pap test results.  Other health care providers may not recommend any screening for nonpregnant women who are considered low risk for pelvic cancer and who do not have symptoms. Ask your health care provider if a screening pelvic exam is right for you.  If you have had past treatment for cervical cancer or a condition that could lead to cancer, you need Pap tests and screening for cancer for at least 20 years after your treatment. If Pap tests have Tucker discontinued, your risk factors (such as having a new sexual partner) need to be reassessed to determine if screening should resume. Some women have medical problems that increase the chance of getting cervical cancer. In these cases, your health care provider  may recommend more frequent screening and Pap tests. Colorectal Cancer  This type of cancer can be detected and often prevented.  Routine colorectal cancer screening usually begins at 70 years of age and continues through 70 years of age.  Your health care provider may recommend screening at an earlier age if you have risk factors for colon cancer.  Your health care provider may also recommend using home test kits to check for hidden blood in the stool.  A small camera at the end of a tube can be used to examine your colon directly (sigmoidoscopy or colonoscopy). This is done to check for the earliest forms of colorectal cancer.  Routine screening usually begins at age 49.  Direct examination of the colon should be repeated every 5-10 years through 70 years of age. However, you may need to be screened more often if early forms of precancerous polyps or small growths are found. Skin Cancer  Check your skin from head to toe regularly.  Tell your health care provider about any new  moles or changes in moles, especially if there is a change in a mole's shape or color.  Also tell your health care provider if you have a mole that is larger than the size of a pencil eraser.  Always use sunscreen. Apply sunscreen liberally and repeatedly throughout the day.  Protect yourself by wearing long sleeves, pants, a wide-brimmed hat, and sunglasses whenever you are outside. HEART DISEASE, DIABETES, AND HIGH BLOOD PRESSURE   High blood pressure causes heart disease and increases the risk of stroke. High blood pressure is more likely to develop in:  People who have blood pressure in the high end of the normal range (130-139/85-89 mm Hg).  People who are overweight or obese.  People who are African American.  If you are 105-26 years of age, have your blood pressure checked every 3-5 years. If you are 72 years of age or older, have your blood pressure checked every year. You should have your blood pressure measured twice--once when you are at a hospital or clinic, and once when you are not at a hospital or clinic. Record the average of the two measurements. To check your blood pressure when you are not at a hospital or clinic, you can use:  An automated blood pressure machine at a pharmacy.  A home blood pressure monitor.  If you are between 61 years and 64 years old, ask your health care provider if you should take aspirin to prevent strokes.  Have regular diabetes screenings. This involves taking a blood sample to check your fasting blood sugar level.  If you are at a normal weight and have a low risk for diabetes, have this test once every three years after 70 years of age.  If you are overweight and have a high risk for diabetes, consider being tested at a younger age or more often. PREVENTING INFECTION  Hepatitis B  If you have a higher risk for hepatitis B, you should be screened for this virus. You are considered at high risk for hepatitis B if:  You were born in a  country where hepatitis B is common. Ask your health care provider which countries are considered high risk.  Your parents were born in a high-risk country, and you have not Tucker immunized against hepatitis B (hepatitis B vaccine).  You have HIV or AIDS.  You use needles to inject street drugs.  You live with someone who has hepatitis B.  You have had  sex with someone who has hepatitis B.  You get hemodialysis treatment.  You take certain medicines for conditions, including cancer, organ transplantation, and autoimmune conditions. Hepatitis C  Blood testing is recommended for:  Everyone born from [birth year] through 1965.  Anyone with known risk factors for hepatitis C. Sexually transmitted infections (STIs)  You should be screened for sexually transmitted infections (STIs) including gonorrhea and chlamydia if:  You are sexually active and are younger than 70 years of age.  You are older than 70 years of age and your health care provider tells you that you are at risk for this type of infection.  Your sexual activity has changed since you were last screened and you are at an increased risk for chlamydia or gonorrhea. Ask your health care provider if you are at risk.  If you do not have HIV, but are at risk, it may be recommended that you take a prescription medicine daily to prevent HIV infection. This is called pre-exposure prophylaxis (PrEP). You are considered at risk if:  You are sexually active and do not regularly use condoms or know the HIV status of your partner(s).  You take drugs by injection.  You are sexually active with a partner who has HIV. Talk with your health care provider about whether you are at high risk of being infected with HIV. If you choose to begin PrEP, you should first be tested for HIV. You should then be tested every 3 months for as long as you are taking PrEP.  PREGNANCY   If you are premenopausal and you may become pregnant, ask your health care  provider about preconception counseling.  If you may become pregnant, take 400 to 800 micrograms (mcg) of folic acid every day.  If you want to prevent pregnancy, talk to your health care provider about birth control (contraception). OSTEOPOROSIS AND MENOPAUSE   Osteoporosis is a disease in which the bones lose minerals and strength with aging. This can result in serious bone fractures. Your risk for osteoporosis can be identified using a bone density scan.  If you are 44 years of age or older, or if you are at risk for osteoporosis and fractures, ask your health care provider if you should be screened.  Ask your health care provider whether you should take a calcium or vitamin D supplement to lower your risk for osteoporosis.  Menopause may have certain physical symptoms and risks.  Hormone replacement therapy may reduce some of these symptoms and risks. Talk to your health care provider about whether hormone replacement therapy is right for you.  HOME CARE INSTRUCTIONS   Schedule regular health, dental, and eye exams.  Stay current with your immunizations.   Do not use any tobacco products including cigarettes, chewing tobacco, or electronic cigarettes.  If you are pregnant, do not drink alcohol.  If you are breastfeeding, limit how much and how often you drink alcohol.  Limit alcohol intake to no more than 1 drink per day for nonpregnant women. One drink equals 12 ounces of beer, 5 ounces of wine, or 1 ounces of hard liquor.  Do not use street drugs.  Do not share needles.  Ask your health care provider for help if you need support or information about quitting drugs.  Tell your health care provider if you often feel depressed.  Tell your health care provider if you have ever Tucker abused or do not feel safe at home.   This information is not intended to replace advice  given to you by your health care provider. Make sure you discuss any questions you have with your health  care provider.   Document Released: 07/25/2010 Document Revised: 01/30/2014 Document Reviewed: 12/11/2012 Elsevier Interactive Patient Education [employer].

## 2015-11-10 ENCOUNTER — Ambulatory Visit (INDEPENDENT_AMBULATORY_CARE_PROVIDER_SITE_OTHER): Payer: Commercial Managed Care - HMO | Admitting: Internal Medicine

## 2015-11-10 ENCOUNTER — Other Ambulatory Visit: Payer: Self-pay | Admitting: Internal Medicine

## 2015-11-10 ENCOUNTER — Ambulatory Visit (INDEPENDENT_AMBULATORY_CARE_PROVIDER_SITE_OTHER)
Admission: RE | Admit: 2015-11-10 | Discharge: 2015-11-10 | Disposition: A | Discharge status: Home / Self Care | Payer: Commercial Managed Care - HMO | Source: Ambulatory Visit | Attending: Internal Medicine | Admitting: Internal Medicine

## 2015-11-10 ENCOUNTER — Other Ambulatory Visit (INDEPENDENT_AMBULATORY_CARE_PROVIDER_SITE_OTHER): Payer: Commercial Managed Care - HMO

## 2015-11-10 ENCOUNTER — Encounter: Payer: Self-pay | Admitting: Internal Medicine

## 2015-11-10 ENCOUNTER — Telehealth: Payer: Self-pay | Admitting: Internal Medicine

## 2015-11-10 VITALS — BP 134/88 | HR 76 | Temp 97.7°F | Resp 16 | Ht 66.0 in | Wt 138.0 lb

## 2015-11-10 DIAGNOSIS — Z0000 Encounter for general adult medical examination without abnormal findings: Secondary | ICD-10-CM

## 2015-11-10 DIAGNOSIS — R739 Hyperglycemia, unspecified: Secondary | ICD-10-CM

## 2015-11-10 DIAGNOSIS — Z780 Asymptomatic menopausal state: Secondary | ICD-10-CM

## 2015-11-10 DIAGNOSIS — K219 Gastro-esophageal reflux disease without esophagitis: Secondary | ICD-10-CM

## 2015-11-10 DIAGNOSIS — Z13820 Encounter for screening for osteoporosis: Secondary | ICD-10-CM

## 2015-11-10 DIAGNOSIS — Z1159 Encounter for screening for other viral diseases: Secondary | ICD-10-CM

## 2015-11-10 DIAGNOSIS — F419 Anxiety disorder, unspecified: Secondary | ICD-10-CM

## 2015-11-10 DIAGNOSIS — M8580 Other specified disorders of bone density and structure, unspecified site: Secondary | ICD-10-CM

## 2015-11-10 DIAGNOSIS — Z1231 Encounter for screening mammogram for malignant neoplasm of breast: Secondary | ICD-10-CM

## 2015-11-10 DIAGNOSIS — E038 Other specified hypothyroidism: Secondary | ICD-10-CM | POA: Diagnosis not present

## 2015-11-10 DIAGNOSIS — E785 Hyperlipidemia, unspecified: Secondary | ICD-10-CM

## 2015-11-10 LAB — COMPREHENSIVE METABOLIC PANEL
ALBUMIN: 4.3 g/dL (ref 3.5–5.2)
ALT: 15 U/L (ref 0–35)
AST: 19 U/L (ref 0–37)
Alkaline Phosphatase: 79 U/L (ref 39–117)
BUN: 12 mg/dL (ref 6–23)
CALCIUM: 9.4 mg/dL (ref 8.4–10.5)
CHLORIDE: 100 meq/L (ref 96–112)
CO2: 30 meq/L (ref 19–32)
CREATININE: 0.85 mg/dL (ref 0.40–1.20)
GFR: 70.14 mL/min (ref >60.00)
Glucose, Bld: 96 mg/dL (ref 70–99)
POTASSIUM: 4.2 meq/L (ref 3.5–5.1)
SODIUM: 137 meq/L (ref 135–145)
Total Bilirubin: 0.7 mg/dL (ref 0.2–1.2)
Total Protein: 7.2 g/dL (ref 6.0–8.3)

## 2015-11-10 LAB — CBC WITH DIFFERENTIAL/PLATELET
BASOS PCT: 0.5 % (ref 0.0–3.0)
Basophils Absolute: 0 10*3/uL (ref 0.0–0.1)
EOS ABS: 0.2 10*3/uL (ref 0.0–0.7)
EOS PCT: 3.3 % (ref 0.0–5.0)
HEMATOCRIT: 42.5 % (ref 36.0–46.0)
Hemoglobin: 14.3 g/dL (ref 12.0–15.0)
Lymphocytes Relative: 26.4 % (ref 12.0–46.0)
Lymphs Abs: 1.5 10*3/uL (ref 0.7–4.0)
MCHC: 33.5 g/dL (ref 30.0–36.0)
MCV: 89 fl (ref 78.0–100.0)
MONO ABS: 0.4 10*3/uL (ref 0.1–1.0)
Monocytes Relative: 7 % (ref 3.0–12.0)
NEUTROS ABS: 3.6 10*3/uL (ref 1.4–7.7)
Neutrophils Relative %: 62.8 % (ref 43.0–77.0)
PLATELETS: 261 10*3/uL (ref 150.0–400.0)
RBC: 4.78 Mil/uL (ref 3.87–5.11)
RDW: 13.7 % (ref 11.5–15.5)
WBC: 5.8 10*3/uL (ref 4.0–10.5)

## 2015-11-10 LAB — LIPID PANEL
CHOL/HDL RATIO: 4
CHOLESTEROL: 219 mg/dL — AB (ref 0–200)
HDL: 54.7 mg/dL (ref >39.00)
LDL CALC: 147 mg/dL — AB (ref 0–99)
NonHDL: 164.17
TRIGLYCERIDES: 88 mg/dL (ref 0.0–149.0)
VLDL: 17.6 mg/dL (ref 0.0–40.0)

## 2015-11-10 LAB — TSH: TSH: 3 u[IU]/mL (ref 0.35–4.50)

## 2015-11-10 LAB — HEMOGLOBIN A1C: HEMOGLOBIN A1C: 5.7 % (ref 4.6–6.5)

## 2015-11-10 NOTE — Progress Notes (Signed)
Pre visit review using our clinic review tool, if applicable. No additional management support is needed unless otherwise documented below in the visit note. 

## 2015-11-10 NOTE — Telephone Encounter (Signed)
Pt called back to tell Dr Quay Burow that the cologuard test is cover under her ins.  She said ok to order

## 2015-11-10 NOTE — Assessment & Plan Note (Signed)
Uses xanax only as needed, not often No side effects Continue same

## 2015-11-10 NOTE — Telephone Encounter (Signed)
If okay to order, I will place the order online.

## 2015-11-10 NOTE — Assessment & Plan Note (Signed)
Check lipids. Continue statin.

## 2015-11-10 NOTE — Telephone Encounter (Signed)
Yes, please order

## 2015-11-10 NOTE — Assessment & Plan Note (Signed)
dexa due - ordered Continue regular exercise, cal and vit d

## 2015-11-10 NOTE — Assessment & Plan Note (Signed)
Takes omeprazole as needed - has not needed it months continue omeprazole prn

## 2015-11-11 ENCOUNTER — Encounter: Payer: Self-pay | Admitting: Internal Medicine

## 2015-11-11 LAB — HEPATITIS C ANTIBODY: HCV Ab: NEGATIVE

## 2015-11-15 ENCOUNTER — Encounter: Payer: Self-pay | Admitting: Internal Medicine

## 2015-11-16 NOTE — Telephone Encounter (Signed)
Cologuard order has been place via online portal.

## 2015-12-07 ENCOUNTER — Encounter: Payer: Self-pay | Admitting: Internal Medicine

## 2015-12-07 DIAGNOSIS — Z1212 Encounter for screening for malignant neoplasm of rectum: Secondary | ICD-10-CM | POA: Diagnosis not present

## 2015-12-07 DIAGNOSIS — Z1211 Encounter for screening for malignant neoplasm of colon: Secondary | ICD-10-CM | POA: Diagnosis not present

## 2015-12-07 LAB — COLOGUARD: COLOGUARD: POSITIVE

## 2015-12-15 ENCOUNTER — Ambulatory Visit
Admission: RE | Admit: 2015-12-15 | Discharge: 2015-12-15 | Disposition: A | Discharge status: Home / Self Care | Payer: Commercial Managed Care - HMO | Source: Ambulatory Visit | Attending: Internal Medicine | Admitting: Internal Medicine

## 2015-12-15 DIAGNOSIS — Z1231 Encounter for screening mammogram for malignant neoplasm of breast: Secondary | ICD-10-CM

## 2015-12-23 ENCOUNTER — Telehealth: Payer: Self-pay | Admitting: Emergency Medicine

## 2015-12-23 DIAGNOSIS — Z1211 Encounter for screening for malignant neoplasm of colon: Secondary | ICD-10-CM

## 2015-12-23 NOTE — Telephone Encounter (Signed)
Spoke with pt to inform of cologaurd results. Pt is okay with having colonoscopy done. Please enter referral

## 2015-12-24 ENCOUNTER — Other Ambulatory Visit: Payer: Self-pay | Admitting: Internal Medicine

## 2015-12-24 MED ORDER — ALPRAZOLAM 0.25 MG PO TABS: ORAL_TABLET | ORAL | 0 refills | Status: DC

## 2015-12-24 NOTE — Telephone Encounter (Signed)
[place] controlled substance database checked - last filled 08/16/15.  Ok to fill medication. rx printed.

## 2015-12-24 NOTE — Telephone Encounter (Signed)
RX faxed to [facility] 

## 2015-12-30 ENCOUNTER — Encounter: Payer: Self-pay | Admitting: Internal Medicine

## 2016-02-04 ENCOUNTER — Ambulatory Visit (AMBULATORY_SURGERY_CENTER): Payer: Self-pay | Admitting: *Deleted

## 2016-02-04 VITALS — Ht 65.0 in | Wt 139.8 lb

## 2016-02-04 DIAGNOSIS — R195 Other fecal abnormalities: Secondary | ICD-10-CM

## 2016-02-04 MED ORDER — NA SULFATE-K SULFATE-MG SULF 17.5-3.13-1.6 GM/177ML PO SOLN: 1.0000 | Freq: Once | ORAL | 0 refills | Status: AC

## 2016-02-04 NOTE — Progress Notes (Signed)
No allergies to eggs or soy. No problems with anesthesia.  Pt given Emmi instructions for colonoscopy  No oxygen use  No diet drug use  

## 2016-02-07 ENCOUNTER — Encounter: Payer: Self-pay | Admitting: Gastroenterology

## 2016-02-15 ENCOUNTER — Encounter: Payer: Self-pay | Admitting: Gastroenterology

## 2016-02-15 ENCOUNTER — Ambulatory Visit (AMBULATORY_SURGERY_CENTER): Payer: Medicare HMO | Admitting: Gastroenterology

## 2016-02-15 VITALS — BP 120/59 | HR 71 | Temp 97.3°F | Resp 11 | Ht 65.5 in | Wt 139.0 lb

## 2016-02-15 DIAGNOSIS — K219 Gastro-esophageal reflux disease without esophagitis: Secondary | ICD-10-CM | POA: Diagnosis not present

## 2016-02-15 DIAGNOSIS — D127 Benign neoplasm of rectosigmoid junction: Secondary | ICD-10-CM | POA: Diagnosis not present

## 2016-02-15 DIAGNOSIS — D125 Benign neoplasm of sigmoid colon: Secondary | ICD-10-CM | POA: Diagnosis not present

## 2016-02-15 DIAGNOSIS — K635 Polyp of colon: Secondary | ICD-10-CM | POA: Diagnosis not present

## 2016-02-15 DIAGNOSIS — D128 Benign neoplasm of rectum: Secondary | ICD-10-CM

## 2016-02-15 DIAGNOSIS — E039 Hypothyroidism, unspecified: Secondary | ICD-10-CM | POA: Diagnosis not present

## 2016-02-15 DIAGNOSIS — D126 Benign neoplasm of colon, unspecified: Secondary | ICD-10-CM | POA: Diagnosis not present

## 2016-02-15 DIAGNOSIS — D120 Benign neoplasm of cecum: Secondary | ICD-10-CM

## 2016-02-15 DIAGNOSIS — R195 Other fecal abnormalities: Secondary | ICD-10-CM | POA: Diagnosis present

## 2016-02-15 DIAGNOSIS — R69 Illness, unspecified: Secondary | ICD-10-CM | POA: Diagnosis not present

## 2016-02-15 MED ORDER — SODIUM CHLORIDE 0.9 % IV SOLN: 500.0000 mL | INTRAVENOUS | Status: DC

## 2016-02-15 NOTE — Op Note (Signed)
[place] Patient Name: Amy Tucker Procedure Date: 02/15/2016 1:44 PM MRN: [id number] Endoscopist: Mauri Pole , MD Age: 71 Referring MD:  Date of Birth: 12/30/1945 Gender: Female Account #: [id number] Procedure:                Colonoscopy Indications:              Screening for colorectal malignant neoplasm, This                            is the patient's first colonoscopy Medicines:                Monitored Anesthesia Care Procedure:                Pre-Anesthesia Assessment:                           - Prior to the procedure, a History and Physical                            was performed, and patient medications and                            allergies were reviewed. The patient's tolerance of                            previous anesthesia was also reviewed. The risks                            and benefits of the procedure and the sedation                            options and risks were discussed with the patient.                            All questions were answered, and informed consent                            was obtained. Prior Anticoagulants: The patient has                            taken no previous anticoagulant or antiplatelet                            agents. ASA Grade Assessment: II - A patient with                            mild systemic disease. After reviewing the risks                            and benefits, the patient was deemed in                            satisfactory condition to undergo the procedure.  After obtaining informed consent, the colonoscope                            was passed under direct vision. Throughout the                            procedure, the patient's blood pressure, pulse, and                            oxygen saturations were monitored continuously. The                            Model CF-[id number]) scope was introduced                            through the anus and  advanced to the the terminal                            ileum, with identification of the appendiceal                            orifice and IC valve. The colonoscopy was performed                            without difficulty. The patient tolerated the                            procedure well. The quality of the bowel                            preparation was excellent. The terminal ileum,                            ileocecal valve, appendiceal orifice, and rectum                            were photographed. Scope In: 1:55:15 PM Scope Out: 2:16:43 PM Scope Withdrawal Time: 0 hours 11 minutes 39 seconds  Total Procedure Duration: 0 hours 21 minutes 28 seconds  Findings:                 The perianal and digital rectal examinations were                            normal.                           Four sessile polyps were found in the recto-sigmoid                            colon, sigmoid colon and cecum. The polyps were 4                            to 9 mm in size. These polyps were removed with a  cold snare. Resection and retrieval were complete.                           A 20 mm polyp was found in the rectum. The polyp                            was pedunculated with ulceration at the tip. The                            polyp was removed with a hot snare. Resection and                            retrieval were complete.                           Scattered small-mouthed diverticula were found in                            the sigmoid colon.                           Non-bleeding internal hemorrhoids were found during                            retroflexion. The hemorrhoids were small.                           The exam was otherwise without abnormality. Complications:            No immediate complications. Estimated Blood Loss:     Estimated blood loss was minimal. Impression:               - Four 4 to 9 mm polyps at the recto-sigmoid colon,                             in the sigmoid colon and in the cecum, removed with                            a cold snare. Resected and retrieved.                           - One 20 mm polyp in the rectum, removed with a hot                            snare. Resected and retrieved.                           - Diverticulosis in the sigmoid colon.                           - Non-bleeding internal hemorrhoids.                           - The examination was otherwise normal. Recommendation:           -  Patient has a contact number available for                            emergencies. The signs and symptoms of potential                            delayed complications were discussed with the                            patient. Return to normal activities tomorrow.                            Written discharge instructions were provided to the                            patient.                           - Resume previous diet.                           - Continue present medications.                           - Await pathology results.                           - Repeat colonoscopy in 3 years for surveillance                            based on pathology results.                           - Return to GI clinic PRN. Mauri Pole, MD 02/15/2016 2:21:31 PM This report has been signed electronically.

## 2016-02-15 NOTE — Progress Notes (Signed)
Called to room to assist during endoscopic procedure.  Patient ID and intended procedure confirmed with present staff. Received instructions for my participation in the procedure from the performing physician.  

## 2016-02-15 NOTE — Progress Notes (Signed)
Report to PACU, RN, vss, BBS= Clear.  

## 2016-02-15 NOTE — Progress Notes (Signed)
No problems noted in the recovery room. maw 

## 2016-02-15 NOTE — Patient Instructions (Addendum)
YOU HAD AN ENDOSCOPIC PROCEDURE TODAY AT [facility]:   Refer to the procedure report that was given to you for any specific questions about what was found during the examination.  If the procedure report does not answer your questions, please call your gastroenterologist to clarify.  If you requested that your care partner not be given the details of your procedure findings, then the procedure report has been included in a sealed envelope for you to review at your convenience later.  YOU SHOULD EXPECT: Some feelings of bloating in the abdomen. Passage of more gas than usual.  Walking can help get rid of the air that was put into your GI tract during the procedure and reduce the bloating. If you had a lower endoscopy (such as a colonoscopy or flexible sigmoidoscopy) you may notice spotting of blood in your stool or on the toilet paper. If you underwent a bowel prep for your procedure, you may not have a normal bowel movement for a few days.  Please Note:  You might notice some irritation and congestion in your nose or some drainage.  This is from the oxygen used during your procedure.  There is no need for concern and it should clear up in a day or so.  SYMPTOMS TO REPORT IMMEDIATELY:   Following lower endoscopy (colonoscopy or flexible sigmoidoscopy):  Excessive amounts of blood in the stool  Significant tenderness or worsening of abdominal pains  Swelling of the abdomen that is new, acute  Fever of 100F or higher   Following upper endoscopy (EGD)  Vomiting of blood or coffee ground material  New chest pain or pain under the shoulder blades  Painful or persistently difficult swallowing  New shortness of breath  Fever of 100F or higher  Black, tarry-looking stools  For urgent or emergent issues, a gastroenterologist can be reached at any hour by calling [phone].   DIET:  We do recommend a small meal at first, but then you may proceed to your regular diet.  Drink  plenty of fluids but you should avoid alcoholic beverages for 24 hours.  ACTIVITY:  You should plan to take it easy for the rest of today and you should NOT DRIVE or use heavy machinery until tomorrow (because of the sedation medicines used during the test).    FOLLOW UP: Our staff will call the number listed on your records the next business day following your procedure to check on you and address any questions or concerns that you may have regarding the information given to you following your procedure. If we do not reach you, we will leave a message.  However, if you are feeling well and you are not experiencing any problems, there is no need to return our call.  We will assume that you have returned to your regular daily activities without incident.  If any biopsies were taken you will be contacted by phone or by letter within the next 1-3 weeks.  Please call us at [phone] if you have not heard about the biopsies in 3 weeks.    SIGNATURES/CONFIDENTIALITY: You and/or your care partner have signed paperwork which will be entered into your electronic medical record.  These signatures attest to the fact that that the information above on your After Visit Summary has been reviewed and is understood.  Full responsibility of the confidentiality of this discharge information lies with you and/or your care-partner.    Handouts were given to your care partner on polyps,  diverticulosis, and hemorrhoids. You may resume your current medications today. Await biopsy results. Please call if any questions or concerns.   

## 2016-02-16 ENCOUNTER — Telehealth: Payer: Self-pay

## 2016-02-16 NOTE — Telephone Encounter (Signed)
  Follow up Call-  Call back number 02/15/2016  Post procedure Call Back phone  # [phone]  Permission to leave phone message Yes  Some recent data might be hidden     Patient questions:  Do you have a fever, pain , or abdominal swelling? No. Pain Score  0 *  Have you tolerated food without any problems? Yes.    Have you been able to return to your normal activities? Yes.    Do you have any questions about your discharge instructions: Diet   No. Medications  No. Follow up visit  No.  Do you have questions or concerns about your Care? No.  Actions: * If pain score is 4 or above: No action needed, pain <4.

## 2016-02-24 ENCOUNTER — Ambulatory Visit (INDEPENDENT_AMBULATORY_CARE_PROVIDER_SITE_OTHER): Payer: Medicare HMO | Admitting: Internal Medicine

## 2016-02-24 ENCOUNTER — Encounter: Payer: Self-pay | Admitting: Internal Medicine

## 2016-02-24 ENCOUNTER — Encounter: Payer: Commercial Managed Care - HMO | Admitting: Internal Medicine

## 2016-02-24 DIAGNOSIS — J069 Acute upper respiratory infection, unspecified: Secondary | ICD-10-CM | POA: Diagnosis not present

## 2016-02-24 DIAGNOSIS — B9789 Other viral agents as the cause of diseases classified elsewhere: Secondary | ICD-10-CM | POA: Diagnosis not present

## 2016-02-24 MED ORDER — HYDROCODONE-HOMATROPINE 5-1.5 MG/5ML PO SYRP: 5.0000 mL | ORAL_SOLUTION | Freq: Three times a day (TID) | ORAL | 0 refills | Status: DC | PRN

## 2016-02-24 NOTE — Assessment & Plan Note (Signed)
Rx for hycodan cough syrup. No indication for antibiotics or steroids today. Reassured about course and likely duration of symptoms.

## 2016-02-24 NOTE — Progress Notes (Signed)
   Subjective:    Patient ID: Amy Tucker, female    DOB: [birth year], 71 y.o.   MRN: [id number]  HPI The patient is a 71 YO female coming in for viral cold symptoms for about 2-3 days. Some cough which is not productive. She is having some chills and fever. No headaches, no muscle aches. Mild nasal production but not much drainage. She denies fatigue.   Review of Systems  Constitutional: Positive for chills and fever. Negative for activity change, appetite change, fatigue and unexpected weight change.  HENT: Positive for congestion, postnasal drip and rhinorrhea. Negative for ear discharge, ear pain, sinus pain, sinus pressure, sore throat and trouble swallowing.   Eyes: Negative.   Respiratory: Positive for cough. Negative for chest tightness, shortness of breath and wheezing.   Cardiovascular: Negative.   Gastrointestinal: Negative.   Musculoskeletal: Negative.       Objective:   Physical Exam  Constitutional: She appears well-developed and well-nourished.  HENT:  Head: Normocephalic and atraumatic.  Oropharynx with redness and clear drainage, nose without crusting.   Eyes: EOM are normal.  Neck: Normal range of motion.  Cardiovascular: Normal rate and regular rhythm.   Pulmonary/Chest: Effort normal and breath sounds normal.  Abdominal: Soft.  Lymphadenopathy:    She has no cervical adenopathy.  Skin: Skin is warm and dry.   Vitals:   02/24/16 1550  BP: 134/82  Pulse: 78  Resp: 12  Temp: 98.4 F (36.9 C)  TempSrc: Oral  SpO2: 99%  Weight: 139 lb 8 oz (63.3 kg)  Height: 5' 5.5" (1.664 m)       Assessment & Plan:

## 2016-02-24 NOTE — Patient Instructions (Signed)
We have given you the cough syrup today to use at night time for the cough.   Upper Respiratory Infection, Adult Most upper respiratory infections (URIs) are a viral infection of the air passages leading to the lungs. A URI affects the nose, throat, and upper air passages. The most common type of URI is nasopharyngitis and is typically referred to as "the common cold." URIs run their course and usually go away on their own. Most of the time, a URI does not require medical attention, but sometimes a bacterial infection in the upper airways can follow a viral infection. This is called a secondary infection. Sinus and middle ear infections are common types of secondary upper respiratory infections. Bacterial pneumonia can also complicate a URI. A URI can worsen asthma and chronic obstructive pulmonary disease (COPD). Sometimes, these complications can require emergency medical care and may be life threatening. What are the causes? Almost all URIs are caused by viruses. A virus is a type of germ and can spread from one person to another. What increases the risk? You may be at risk for a URI if:  You smoke.  You have chronic heart or lung disease.  You have a weakened defense (immune) system.  You are very young or very old.  You have nasal allergies or asthma.  You work in crowded or poorly ventilated areas.  You work in health care facilities or schools. What are the signs or symptoms? Symptoms typically develop 2-3 days after you come in contact with a cold virus. Most viral URIs last 7-10 days. However, viral URIs from the influenza virus (flu virus) can last 14-18 days and are typically more severe. Symptoms may include:  Runny or stuffy (congested) nose.  Sneezing.  Cough.  Sore throat.  Headache.  Fatigue.  Fever.  Loss of appetite.  Pain in your forehead, behind your eyes, and over your cheekbones (sinus pain).  Muscle aches. How is this diagnosed? Your health care  provider may diagnose a URI by:  Physical exam.  Tests to check that your symptoms are not due to another condition such as:  Strep throat.  Sinusitis.  Pneumonia.  Asthma. How is this treated? A URI goes away on its own with time. It cannot be cured with medicines, but medicines may be prescribed or recommended to relieve symptoms. Medicines may help:  Reduce your fever.  Reduce your cough.  Relieve nasal congestion. Follow these instructions at home:  Take medicines only as directed by your health care provider.  Gargle warm saltwater or take cough drops to comfort your throat as directed by your health care provider.  Use a warm mist humidifier or inhale steam from a shower to increase air moisture. This may make it easier to breathe.  Drink enough fluid to keep your urine clear or pale yellow.  Eat soups and other clear broths and maintain good nutrition.  Rest as needed.  Return to work when your temperature has returned to normal or as your health care provider advises. You may need to stay home longer to avoid infecting others. You can also use a face mask and careful hand washing to prevent spread of the virus.  Increase the usage of your inhaler if you have asthma.  Do not use any tobacco products, including cigarettes, chewing tobacco, or electronic cigarettes. If you need help quitting, ask your health care provider. How is this prevented? The best way to protect yourself from getting a cold is to practice good hygiene.  Avoid oral or hand contact with people with cold symptoms.  Wash your hands often if contact occurs. There is no clear evidence that vitamin C, vitamin E, echinacea, or exercise reduces the chance of developing a cold. However, it is always recommended to get plenty of rest, exercise, and practice good nutrition. Contact a health care provider if:  You are getting worse rather than better.  Your symptoms are not controlled by  medicine.  You have chills.  You have worsening shortness of breath.  You have brown or red mucus.  You have yellow or brown nasal discharge.  You have pain in your face, especially when you bend forward.  You have a fever.  You have swollen neck glands.  You have pain while swallowing.  You have white areas in the back of your throat. Get help right away if:  You have severe or persistent:  Headache.  Ear pain.  Sinus pain.  Chest pain.  You have chronic lung disease and any of the following:  Wheezing.  Prolonged cough.  Coughing up blood.  A change in your usual mucus.  You have a stiff neck.  You have changes in your:  Vision.  Hearing.  Thinking.  Mood. This information is not intended to replace advice given to you by your health care provider. Make sure you discuss any questions you have with your health care provider. Document Released: 07/05/2000 Document Revised: 09/12/2015 Document Reviewed: 04/16/2013 Elsevier Interactive Patient Education  2017 [employer].

## 2016-02-24 NOTE — Progress Notes (Signed)
Pre visit review using our clinic review tool, if applicable. No additional management support is needed unless otherwise documented below in the visit note. 

## 2016-02-28 ENCOUNTER — Encounter: Payer: Self-pay | Admitting: Gastroenterology

## 2016-03-04 DIAGNOSIS — B349 Viral infection, unspecified: Secondary | ICD-10-CM | POA: Diagnosis not present

## 2016-05-01 ENCOUNTER — Other Ambulatory Visit: Payer: Self-pay | Admitting: Internal Medicine

## 2016-05-01 MED ORDER — ALPRAZOLAM 0.25 MG PO TABS: ORAL_TABLET | ORAL | 0 refills | Status: DC

## 2016-05-01 NOTE — Telephone Encounter (Signed)
RX for Xanax has been faxed to [facility]

## 2016-05-01 NOTE — Telephone Encounter (Signed)
[place] controlled substance database checked.  Ok to fill medication. rx printed 

## 2016-10-25 DIAGNOSIS — Z0100 Encounter for examination of eyes and vision without abnormal findings: Secondary | ICD-10-CM | POA: Diagnosis not present

## 2016-11-09 NOTE — Progress Notes (Signed)
Subjective:    Patient ID: Amy Tucker, female    DOB: 12/20/1945, 71 y.o.   MRN: [id number]  HPI    Medications and allergies reviewed with patient and updated if appropriate.  Patient Active Problem List   Diagnosis Date Noted  . Viral URI with cough 02/24/2016  . Anxiety 02/12/2015  . GERD without esophagitis 10/27/2013  . Hyperlipidemia 08/02/2007  . Osteopenia 08/02/2007  . Hypothyroidism 07/31/2006  . Hyperglycemia 07/31/2006    Current Outpatient Prescriptions on File Prior to Visit  Medication Sig Dispense Refill  . ALPRAZolam (XANAX) 0.25 MG tablet TAKE ONE-HALF TABLET BY MOUTH EVERY 8 HOURS AS NEEDED 30 tablet 0  . Biotin w/ Vitamins C & E (HAIR/SKIN/NAILS) 1250-7.5-7.5 MCG-MG-UNT CHEW Chew by mouth daily.    . Cholecalciferol (VITAMIN D3) 1000 UNITS CAPS Take by mouth daily.      [name] HYDROcodone-homatropine (HYCODAN) 5-1.5 MG/5ML syrup Take 5 mLs by mouth every 8 (eight) hours as needed for cough. 120 mL 0  . Multiple Vitamin (MULTIVITAMIN) tablet Take 1 tablet by mouth daily.      . Omega-3 Fatty Acids (FISH OIL PO) Take by mouth.    [name] omeprazole (PRILOSEC OTC) 20 MG tablet Take 20 mg by mouth daily as needed.     . pravastatin (PRAVACHOL) 40 MG tablet TAKE ONE TABLET BY MOUTH ONCE DAILY 90 tablet 1  . Probiotic Product ([employer] ADV PROBIOTIC GUMMIES PO) Take by mouth daily.    [name] SYNTHROID 75 MCG tablet TAKE 1 TABLET BY MOUTH DAILY EXCEPT ON WEDNESDAYS - TAKE 1 & 1/2 TABLET 96 tablet 1   No current facility-administered medications on file prior to visit.     Past Medical History:  Diagnosis Date  . Anxiety   . GERD (gastroesophageal reflux disease)   . Osteopenia    BMD as per Dr Molli Posey, [place]  . Other abnormal glucose    elevated FBS  . Other and unspecified hyperlipidemia    NMR lipoprofile 2004: LDL 169 (2322/1419), TG 130, LDL goal =<100  . Unspecified hypothyroidism     Past Surgical History:  Procedure Laterality Date  . DILATION AND  CURETTAGE OF UTERUS  1987  . EXTERNAL FIXATION ANKLE FRACTURE  1997   post fall (no surgery)  . no colonoscopy     "I don't want to be put to sleep" [facility] reviewed)  . TONSILLECTOMY AND ADENOIDECTOMY  1953    Social History   Social History  . Marital status: Married    Spouse name: N/A  . Number of children: N/A  . Years of education: N/A   Occupational History  . auditor [employer]   Social History Main Topics  . Smoking status: Former Smoker    Packs/day: 2.50    Years: 20.00    Types: Cigarettes    Quit date: 01/23/1990  . Smokeless tobacco: Never Used     Comment: pt smoked from age [age]  (36- 1992),up to 2 ppd  . Alcohol use Yes     Comment: Rarely  . Drug use: No  . Sexual activity: Not on file   Other Topics Concern  . Not on file   Social History Narrative   Regular exercise- walks dog 2/day - total 45 min   Low fat, carb          Family History  Problem Relation Age of Onset  . Thyroid disease Maternal Aunt        goiter  .  Diabetes Unknown        59 M uncles & M aunts  . Leukemia Father   . Diabetes Mother   . Hypertension Mother   . Coronary artery disease Mother   . Heart attack Mother 22  . Stroke Mother        > 89  . Rheum arthritis Mother   . Kidney failure Mother   . Hypertension Brother   . Other Paternal Uncle   . Alzheimer's disease Paternal Aunt   . Cirrhosis Paternal Uncle   . Heart attack Maternal Uncle 55  . Colon cancer Neg Hx     Review of Systems     Objective:  There were no vitals filed for this visit. There were no vitals filed for this visit. There is no height or weight on file to calculate BMI.  Wt Readings from Last 3 Encounters:  02/24/16 139 lb 8 oz (63.3 kg)  02/15/16 139 lb (63 kg)  02/04/16 139 lb 12.8 oz (63.4 kg)     Physical Exam           Assessment & Plan:        This encounter was created in error - please disregard. This encounter was created in error - please  disregard.

## 2016-11-10 ENCOUNTER — Encounter: Payer: Commercial Managed Care - HMO | Admitting: Internal Medicine

## 2016-11-14 ENCOUNTER — Encounter: Payer: Medicare HMO | Admitting: Internal Medicine

## 2016-11-20 ENCOUNTER — Other Ambulatory Visit: Payer: Self-pay | Admitting: Internal Medicine

## 2016-11-20 NOTE — Telephone Encounter (Signed)
She cancelled her recent cpe.  Needs to reschedule.  Send in a 30 day supply, no refills advise she needs to [place]

## 2016-11-20 NOTE — Telephone Encounter (Signed)
Pt has not had TSH checked in over a year, please advise

## 2016-12-25 ENCOUNTER — Encounter: Payer: Self-pay | Admitting: Internal Medicine

## 2016-12-25 ENCOUNTER — Ambulatory Visit (INDEPENDENT_AMBULATORY_CARE_PROVIDER_SITE_OTHER): Payer: Medicare HMO | Admitting: Internal Medicine

## 2016-12-25 VITALS — BP 136/84 | HR 68 | Temp 98.5°F | Resp 16 | Ht 65.5 in | Wt 137.0 lb

## 2016-12-25 DIAGNOSIS — R739 Hyperglycemia, unspecified: Secondary | ICD-10-CM | POA: Diagnosis not present

## 2016-12-25 DIAGNOSIS — F419 Anxiety disorder, unspecified: Secondary | ICD-10-CM | POA: Diagnosis not present

## 2016-12-25 DIAGNOSIS — Z0000 Encounter for general adult medical examination without abnormal findings: Secondary | ICD-10-CM | POA: Diagnosis not present

## 2016-12-25 DIAGNOSIS — E038 Other specified hypothyroidism: Secondary | ICD-10-CM

## 2016-12-25 DIAGNOSIS — K219 Gastro-esophageal reflux disease without esophagitis: Secondary | ICD-10-CM | POA: Diagnosis not present

## 2016-12-25 DIAGNOSIS — E785 Hyperlipidemia, unspecified: Secondary | ICD-10-CM | POA: Diagnosis not present

## 2016-12-25 DIAGNOSIS — M8580 Other specified disorders of bone density and structure, unspecified site: Secondary | ICD-10-CM | POA: Diagnosis not present

## 2016-12-25 MED ORDER — SYNTHROID 75 MCG PO TABS: ORAL_TABLET | ORAL | 3 refills | Status: DC

## 2016-12-25 MED ORDER — PRAVASTATIN SODIUM 40 MG PO TABS: 40.0000 mg | ORAL_TABLET | Freq: Every day | ORAL | 3 refills | Status: DC

## 2016-12-25 MED ORDER — ALPRAZOLAM 0.25 MG PO TABS: ORAL_TABLET | ORAL | 2 refills | Status: DC

## 2016-12-25 NOTE — Assessment & Plan Note (Signed)
Takes Xanax and occasion for anxiety Effective, no side effects We will continue

## 2016-12-25 NOTE — Assessment & Plan Note (Signed)
Last a1c 5.7% Check a1c Low sugar / carb diet Stressed regular exercise

## 2016-12-25 NOTE — Assessment & Plan Note (Addendum)
Check lipid panel  Continue statin Regular exercise and healthy diet encouraged

## 2016-12-25 NOTE — Patient Instructions (Addendum)
Test(s) ordered today. Your results will be released to MyChart (or called to you) after review, usually within 72hours after test completion. If any changes need to be made, you will be notified at that same time.  All other Health Maintenance issues reviewed.   All recommended immunizations and age-appropriate screenings are up-to-date or discussed.  No immunizations administered today.   Medications reviewed and updated.  No changes recommended at this time.  Your prescription(s) have been submitted to your pharmacy. Please take as directed and contact our office if you believe you are having problem(s) with the medication(s).   Please followup in one year   Health Maintenance, Female Adopting a healthy lifestyle and getting preventive care can go a long way to promote health and wellness. Talk with your health care provider about what schedule of regular examinations is right for you. This is a good chance for you to check in with your provider about disease prevention and staying healthy. In between checkups, there are plenty of things you can do on your own. Experts have done a lot of research about which lifestyle changes and preventive measures are most likely to keep you healthy. Ask your health care provider for more information. Weight and diet Eat a healthy diet  Be sure to include plenty of vegetables, fruits, low-fat dairy products, and lean protein.  Do not eat a lot of foods high in solid fats, added sugars, or salt.  Get regular exercise. This is one of the most important things you can do for your health. ? Most adults should exercise for at least 150 minutes each week. The exercise should increase your heart rate and make you sweat (moderate-intensity exercise). ? Most adults should also do strengthening exercises at least twice a week. This is in addition to the moderate-intensity exercise.  Maintain a healthy weight  Body mass index (BMI) is a measurement that can  be used to identify possible weight problems. It estimates body fat based on height and weight. Your health care provider can help determine your BMI and help you achieve or maintain a healthy weight.  For females 20 years of age and older: ? A BMI below 18.5 is considered underweight. ? A BMI of 18.5 to 24.9 is normal. ? A BMI of 25 to 29.9 is considered overweight. ? A BMI of 30 and above is considered obese.  Watch levels of cholesterol and blood lipids  You should start having your blood tested for lipids and cholesterol at 71 years of age, then have this test every 5 years.  You may need to have your cholesterol levels checked more often if: ? Your lipid or cholesterol levels are high. ? You are older than 71 years of age. ? You are at high risk for heart disease.  Cancer screening Lung Cancer  Lung cancer screening is recommended for adults 55-80 years old who are at high risk for lung cancer because of a history of smoking.  A yearly low-dose CT scan of the lungs is recommended for people who: ? Currently smoke. ? Have quit within the past 15 years. ? Have at least a 30-pack-year history of smoking. A pack year is smoking an average of one pack of cigarettes a day for 1 year.  Yearly screening should continue until it has been 15 years since you quit.  Yearly screening should stop if you develop a health problem that would prevent you from having lung cancer treatment.  Breast Cancer  Practice breast self-awareness.   This means understanding how your breasts normally appear and feel.  It also means doing regular breast self-exams. Let your health care provider know about any changes, no matter how small.  If you are in your 20s or 30s, you should have a clinical breast exam (CBE) by a health care provider every 1-3 years as part of a regular health exam.  If you are 40 or older, have a CBE every year. Also consider having a breast X-ray (mammogram) every year.  If you  have a family history of breast cancer, talk to your health care provider about genetic screening.  If you are at high risk for breast cancer, talk to your health care provider about having an MRI and a mammogram every year.  Breast cancer gene (BRCA) assessment is recommended for women who have family members with BRCA-related cancers. BRCA-related cancers include: ? Breast. ? Ovarian. ? Tubal. ? Peritoneal cancers.  Results of the assessment will determine the need for genetic counseling and BRCA1 and BRCA2 testing.  Cervical Cancer Your health care provider may recommend that you be screened regularly for cancer of the pelvic organs (ovaries, uterus, and vagina). This screening involves a pelvic examination, including checking for microscopic changes to the surface of your cervix (Pap test). You may be encouraged to have this screening done every 3 years, beginning at age 21.  For women ages 30-65, health care providers may recommend pelvic exams and Pap testing every 3 years, or they may recommend the Pap and pelvic exam, combined with testing for human papilloma virus (HPV), every 5 years. Some types of HPV increase your risk of cervical cancer. Testing for HPV may also be done on women of any age with unclear Pap test results.  Other health care providers may not recommend any screening for nonpregnant women who are considered low risk for pelvic cancer and who do not have symptoms. Ask your health care provider if a screening pelvic exam is right for you.  If you have had past treatment for cervical cancer or a condition that could lead to cancer, you need Pap tests and screening for cancer for at least 20 years after your treatment. If Pap tests have been discontinued, your risk factors (such as having a new sexual partner) need to be reassessed to determine if screening should resume. Some women have medical problems that increase the chance of getting cervical cancer. In these cases,  your health care provider may recommend more frequent screening and Pap tests.  Colorectal Cancer  This type of cancer can be detected and often prevented.  Routine colorectal cancer screening usually begins at 71 years of age and continues through 71 years of age.  Your health care provider may recommend screening at an earlier age if you have risk factors for colon cancer.  Your health care provider may also recommend using home test kits to check for hidden blood in the stool.  A small camera at the end of a tube can be used to examine your colon directly (sigmoidoscopy or colonoscopy). This is done to check for the earliest forms of colorectal cancer.  Routine screening usually begins at age 50.  Direct examination of the colon should be repeated every 5-10 years through 71 years of age. However, you may need to be screened more often if early forms of precancerous polyps or small growths are found.  Skin Cancer  Check your skin from head to toe regularly.  Tell your health care provider about any   any new moles or changes in moles, especially if there is a change in a mole's shape or color.  Also tell your health care provider if you have a mole that is larger than the size of a pencil eraser.  Always use sunscreen. Apply sunscreen liberally and repeatedly throughout the day.  Protect yourself by wearing long sleeves, pants, a wide-brimmed hat, and sunglasses whenever you are outside.  Heart disease, diabetes, and high blood pressure  High blood pressure causes heart disease and increases the risk of stroke. High blood pressure is more likely to develop in: ? People who have blood pressure in the high end of the normal range (130-139/85-89 mm Hg). ? People who are overweight or obese. ? People who are African American.  If you are 38-35 years of age, have your blood pressure checked every 3-5 years. If you are 37 years of age or older, have your blood pressure checked every year.  You should have your blood pressure measured twice-once when you are at a hospital or clinic, and once when you are not at a hospital or clinic. Record the average of the two measurements. To check your blood pressure when you are not at a hospital or clinic, you can use: ? An automated blood pressure machine at a pharmacy. ? A home blood pressure monitor.  If you are between 45 years and 85 years old, ask your health care provider if you should take aspirin to prevent strokes.  Have regular diabetes screenings. This involves taking a blood sample to check your fasting blood sugar level. ? If you are at a normal weight and have a low risk for diabetes, have this test once every three years after 71 years of age. ? If you are overweight and have a high risk for diabetes, consider being tested at a younger age or more often. Preventing infection Hepatitis B  If you have a higher risk for hepatitis B, you should be screened for this virus. You are considered at high risk for hepatitis B if: ? You were born in a country where hepatitis B is common. Ask your health care provider which countries are considered high risk. ? Your parents were born in a high-risk country, and you have not been immunized against hepatitis B (hepatitis B vaccine). ? You have HIV or AIDS. ? You use needles to inject street drugs. ? You live with someone who has hepatitis B. ? You have had sex with someone who has hepatitis B. ? You get hemodialysis treatment. ? You take certain medicines for conditions, including cancer, organ transplantation, and autoimmune conditions.  Hepatitis C  Blood testing is recommended for: ? Everyone born from [birth year] through 1965. ? Anyone with known risk factors for hepatitis C.  Sexually transmitted infections (STIs)  You should be screened for sexually transmitted infections (STIs) including gonorrhea and chlamydia if: ? You are sexually active and are younger than 71 years of  age. ? You are older than 71 years of age and your health care provider tells you that you are at risk for this type of infection. ? Your sexual activity has changed since you were last screened and you are at an increased risk for chlamydia or gonorrhea. Ask your health care provider if you are at risk.  If you do not have HIV, but are at risk, it may be recommended that you take a prescription medicine daily to prevent HIV infection. This is called pre-exposure prophylaxis (PrEP). You are considered  risk if: ? You are sexually active and do not regularly use condoms or know the HIV status of your partner(s). ? You take drugs by injection. ? You are sexually active with a partner who has HIV.  Talk with your health care provider about whether you are at high risk of being infected with HIV. If you choose to begin PrEP, you should first be tested for HIV. You should then be tested every 3 months for as long as you are taking PrEP. Pregnancy  If you are premenopausal and you may become pregnant, ask your health care provider about preconception counseling.  If you may become pregnant, take 400 to 800 micrograms (mcg) of folic acid every day.  If you want to prevent pregnancy, talk to your health care provider about birth control (contraception). Osteoporosis and menopause  Osteoporosis is a disease in which the bones lose minerals and strength with aging. This can result in serious bone fractures. Your risk for osteoporosis can be identified using a bone density scan.  If you are 65 years of age or older, or if you are at risk for osteoporosis and fractures, ask your health care provider if you should be screened.  Ask your health care provider whether you should take a calcium or vitamin D supplement to lower your risk for osteoporosis.  Menopause may have certain physical symptoms and risks.  Hormone replacement therapy may reduce some of these symptoms and risks. Talk to your health  care provider about whether hormone replacement therapy is right for you. Follow these instructions at home:  Schedule regular health, dental, and eye exams.  Stay current with your immunizations.  Do not use any tobacco products including cigarettes, chewing tobacco, or electronic cigarettes.  If you are pregnant, do not drink alcohol.  If you are breastfeeding, limit how much and how often you drink alcohol.  Limit alcohol intake to no more than 1 drink per day for nonpregnant women. One drink equals 12 ounces of beer, 5 ounces of wine, or 1 ounces of hard liquor.  Do not use street drugs.  Do not share needles.  Ask your health care provider for help if you need support or information about quitting drugs.  Tell your health care provider if you often feel depressed.  Tell your health care provider if you have ever been abused or do not feel safe at home. This information is not intended to replace advice given to you by your health care provider. Make sure you discuss any questions you have with your health care provider. Document Released: 07/25/2010 Document Revised: 06/17/2015 Document Reviewed: 10/13/2014 Elsevier Interactive Patient Education  2018 Elsevier Inc.  

## 2016-12-25 NOTE — Assessment & Plan Note (Signed)
Check tsh  Titrate med dose if needed  

## 2016-12-25 NOTE — Assessment & Plan Note (Signed)
DEXA up-to-date Taking multivitamin, vitamin D Stressed the importance of regular exercise

## 2016-12-25 NOTE — Assessment & Plan Note (Signed)
GERD controlled Continue medication as needed only

## 2016-12-25 NOTE — Progress Notes (Signed)
Subjective:    Patient ID: Amy Tucker, female    DOB: 02/07/1945, 71 y.o.   MRN: [id number]  HPI She is here for a physical exam.   She denies any changes in her history and has no concerns.  She has increased stress due to her husbands medical problems.   Medications and allergies reviewed with patient and updated if appropriate.  Patient Active Problem List   Diagnosis Date Noted  . Anxiety 02/12/2015  . GERD without esophagitis 10/27/2013  . Hyperlipidemia 08/02/2007  . Osteopenia 08/02/2007  . Hypothyroidism 07/31/2006  . Hyperglycemia 07/31/2006    Current Outpatient Medications on File Prior to Visit  Medication Sig Dispense Refill  . ALPRAZolam (XANAX) 0.25 MG tablet TAKE ONE-HALF TABLET BY MOUTH EVERY 8 HOURS AS NEEDED 30 tablet 0  . Biotin w/ Vitamins C & E (HAIR/SKIN/NAILS) 1250-7.5-7.5 MCG-MG-UNT CHEW Chew by mouth daily.    . Cholecalciferol (VITAMIN D3) 1000 UNITS CAPS Take by mouth daily.      . Multiple Vitamin (MULTIVITAMIN) tablet Take 1 tablet by mouth daily.      . Omega-3 Fatty Acids (FISH OIL PO) Take by mouth.    [name] omeprazole (PRILOSEC OTC) 20 MG tablet Take 20 mg by mouth daily as needed.     . pravastatin (PRAVACHOL) 40 MG tablet TAKE ONE TABLET BY MOUTH ONCE DAILY 90 tablet 1  . Probiotic Product ([employer] ADV PROBIOTIC GUMMIES PO) Take by mouth daily.    [name] SYNTHROID 75 MCG tablet Take 1 tablet by mouth daily except on Wed, take 1.5 tablets -- Office visit needed for further refills 34 tablet 0   No current facility-administered medications on file prior to visit.     Past Medical History:  Diagnosis Date  . Anxiety   . GERD (gastroesophageal reflux disease)   . Osteopenia    BMD as per Dr Molli Posey, [place]  . Other abnormal glucose    elevated FBS  . Other and unspecified hyperlipidemia    NMR lipoprofile 2004: LDL 169 (2322/1419), TG 130, LDL goal =<100  . Unspecified hypothyroidism     Past Surgical History:  Procedure Laterality Date   . DILATION AND CURETTAGE OF UTERUS  1987  . EXTERNAL FIXATION ANKLE FRACTURE  1997   post fall (no surgery)  . no colonoscopy     "I don't want to be put to sleep" [facility] reviewed)  . TONSILLECTOMY AND ADENOIDECTOMY  1953    Social History   Socioeconomic History  . Marital status: Married    Spouse name: Not on file  . Number of children: Not on file  . Years of education: Not on file  . Highest education level: Not on file  Social Needs  . Financial resource strain: Not on file  . Food insecurity - worry: Not on file  . Food insecurity - inability: Not on file  . Transportation needs - medical: Not on file  . Transportation needs - non-medical: Not on file  Occupational History  . Occupation: Theme park manager: [employer]  Tobacco Use  . Smoking status: Former Smoker    Packs/day: 2.50    Years: 20.00    Pack years: 50.00    Types: Cigarettes    Last attempt to quit: 01/23/1990    Years since quitting: 26.9  . Smokeless tobacco: Never Used  . Tobacco comment: pt smoked from age [age]  (1973- 1992),up to 2 ppd  Substance and Sexual Activity  .  Alcohol use: Yes    Comment: Rarely  . Drug use: No  . Sexual activity: Not on file  Other Topics Concern  . Not on file  Social History Narrative   Regular exercise- walks dog 2/day - total 45 min   Low fat, carb       Family History  Problem Relation Age of Onset  . Thyroid disease Maternal Aunt        goiter  . Diabetes Unknown        82 M uncles & M aunts  . Leukemia Father   . Diabetes Mother   . Hypertension Mother   . Coronary artery disease Mother   . Heart attack Mother 24  . Stroke Mother        > 36  . Rheum arthritis Mother   . Kidney failure Mother   . Hypertension Brother   . Other Paternal Uncle   . Alzheimer's disease Paternal Aunt   . Cirrhosis Paternal Uncle   . Heart attack Maternal Uncle 55  . Colon cancer Neg Hx     Review of Systems  Constitutional: Negative for chills and  fever.  Eyes: Negative for visual disturbance.  Respiratory: Negative for cough, shortness of breath and wheezing.   Cardiovascular: Negative for chest pain, palpitations and leg swelling.  Gastrointestinal: Negative for abdominal pain, blood in stool, constipation, diarrhea and nausea.       GERD -  Takes pepto prn  Genitourinary: Negative for dysuria and hematuria.  Musculoskeletal: Positive for arthralgias (first thing in the mornings). Negative for back pain and myalgias.  Skin: Negative for color change and rash.  Neurological: Positive for headaches (occ). Negative for light-headedness.  Psychiatric/Behavioral: Negative for dysphoric mood. The patient is nervous/anxious (occ).        Objective:   Vitals:   12/25/16 1416  BP: 136/84  Pulse: 68  Resp: 16  Temp: 98.5 F (36.9 C)  SpO2: 98%   Filed Weights   12/25/16 1416  Weight: 137 lb (62.1 kg)   Body mass index is 22.45 kg/m.  Wt Readings from Last 3 Encounters:  12/25/16 137 lb (62.1 kg)  02/24/16 139 lb 8 oz (63.3 kg)  02/15/16 139 lb (63 kg)     Physical Exam Constitutional: She appears well-developed and well-nourished. No distress.  HENT:  Head: Normocephalic and atraumatic.  Right Ear: External ear normal. Normal ear canal and TM Left Ear: External ear normal.  Normal ear canal and TM Mouth/Throat: Oropharynx is clear and moist.  Eyes: Conjunctivae and EOM are normal.  Neck: Neck supple. No tracheal deviation present. No thyromegaly present.  No carotid bruit  Cardiovascular: Normal rate, regular rhythm and normal heart sounds.   No murmur heard.  No edema. Pulmonary/Chest: Effort normal and breath sounds normal. No respiratory distress. She has no wheezes. She has no rales.  Breast: deferred Abdominal: Soft. She exhibits no distension. There is no tenderness.  Lymphadenopathy: She has no cervical adenopathy.  Skin: Skin is warm and dry. She is not diaphoretic. heating pad burns on abdomen - non  tender Psychiatric: She has a normal mood and affect. Her behavior is normal.        Assessment & Plan:   Physical exam: Screening blood work ordered Immunizations discussed shingles vaccine - defered, influenza vaccine deferred, discussed Prevnar - deferred Colonoscopy up-to-date Mammogram   up-to-date Dexa up-to-date-due 2019 Eye exams   up-to-date EKG     last done 06/2012 - not indicated today  Exercise walking the dog regularly-10,000 steps a day Weight   normal BMI Skin  No concerns Substance abuse   none  See Problem List for Assessment and Plan of chronic medical problems.   FU in one year

## 2016-12-26 ENCOUNTER — Other Ambulatory Visit (INDEPENDENT_AMBULATORY_CARE_PROVIDER_SITE_OTHER): Payer: Medicare HMO

## 2016-12-26 DIAGNOSIS — E038 Other specified hypothyroidism: Secondary | ICD-10-CM | POA: Diagnosis not present

## 2016-12-26 DIAGNOSIS — E785 Hyperlipidemia, unspecified: Secondary | ICD-10-CM

## 2016-12-26 DIAGNOSIS — Z0000 Encounter for general adult medical examination without abnormal findings: Secondary | ICD-10-CM | POA: Diagnosis not present

## 2016-12-26 DIAGNOSIS — R739 Hyperglycemia, unspecified: Secondary | ICD-10-CM | POA: Diagnosis not present

## 2016-12-26 LAB — LIPID PANEL
CHOL/HDL RATIO: 4
Cholesterol: 206 mg/dL — ABNORMAL HIGH (ref 0–200)
HDL: 56.9 mg/dL (ref >39.00)
LDL CALC: 131 mg/dL — AB (ref 0–99)
NonHDL: 148.76
TRIGLYCERIDES: 90 mg/dL (ref 0.0–149.0)
VLDL: 18 mg/dL (ref 0.0–40.0)

## 2016-12-26 LAB — CBC WITH DIFFERENTIAL/PLATELET
BASOS ABS: 0 10*3/uL (ref 0.0–0.1)
BASOS PCT: 0.9 % (ref 0.0–3.0)
Eosinophils Absolute: 0.2 10*3/uL (ref 0.0–0.7)
Eosinophils Relative: 4.3 % (ref 0.0–5.0)
HCT: 42.4 % (ref 36.0–46.0)
Hemoglobin: 14 g/dL (ref 12.0–15.0)
LYMPHS ABS: 1.5 10*3/uL (ref 0.7–4.0)
Lymphocytes Relative: 32 % (ref 12.0–46.0)
MCHC: 32.9 g/dL (ref 30.0–36.0)
MCV: 92.8 fl (ref 78.0–100.0)
MONOS PCT: 8.8 % (ref 3.0–12.0)
Monocytes Absolute: 0.4 10*3/uL (ref 0.1–1.0)
NEUTROS ABS: 2.6 10*3/uL (ref 1.4–7.7)
NEUTROS PCT: 54 % (ref 43.0–77.0)
PLATELETS: 252 10*3/uL (ref 150.0–400.0)
RBC: 4.57 Mil/uL (ref 3.87–5.11)
RDW: 13.6 % (ref 11.5–15.5)
WBC: 4.8 10*3/uL (ref 4.0–10.5)

## 2016-12-26 LAB — COMPREHENSIVE METABOLIC PANEL
ALT: 15 U/L (ref 0–35)
AST: 18 U/L (ref 0–37)
Albumin: 4 g/dL (ref 3.5–5.2)
Alkaline Phosphatase: 75 U/L (ref 39–117)
BUN: 12 mg/dL (ref 6–23)
CHLORIDE: 100 meq/L (ref 96–112)
CO2: 30 meq/L (ref 19–32)
Calcium: 9.2 mg/dL (ref 8.4–10.5)
Creatinine, Ser: 0.77 mg/dL (ref 0.40–1.20)
GFR: 78.36 mL/min (ref >60.00)
GLUCOSE: 96 mg/dL (ref 70–99)
POTASSIUM: 4 meq/L (ref 3.5–5.1)
Sodium: 137 mEq/L (ref 135–145)
Total Bilirubin: 0.5 mg/dL (ref 0.2–1.2)
Total Protein: 6.6 g/dL (ref 6.0–8.3)

## 2016-12-26 LAB — TSH: TSH: 3.25 u[IU]/mL (ref 0.35–4.50)

## 2016-12-26 LAB — HEMOGLOBIN A1C: Hgb A1c MFr Bld: 5.9 % (ref 4.6–6.5)

## 2016-12-28 ENCOUNTER — Encounter: Payer: Self-pay | Admitting: Internal Medicine

## 2017-02-13 ENCOUNTER — Encounter: Payer: Self-pay | Admitting: Internal Medicine

## 2017-02-13 DIAGNOSIS — M79672 Pain in left foot: Secondary | ICD-10-CM

## 2017-04-02 ENCOUNTER — Encounter: Payer: Self-pay | Admitting: Podiatry

## 2017-04-02 ENCOUNTER — Ambulatory Visit (INDEPENDENT_AMBULATORY_CARE_PROVIDER_SITE_OTHER): Payer: Medicare HMO

## 2017-04-02 ENCOUNTER — Other Ambulatory Visit: Payer: Self-pay | Admitting: Podiatry

## 2017-04-02 ENCOUNTER — Ambulatory Visit: Payer: Medicare HMO | Admitting: Podiatry

## 2017-04-02 DIAGNOSIS — M79672 Pain in left foot: Secondary | ICD-10-CM

## 2017-04-02 DIAGNOSIS — M722 Plantar fascial fibromatosis: Secondary | ICD-10-CM

## 2017-04-02 DIAGNOSIS — M659 Synovitis and tenosynovitis, unspecified: Secondary | ICD-10-CM

## 2017-04-02 NOTE — Progress Notes (Signed)
   Subjective:    Patient ID: Amy Tucker, female    DOB: 08/06/1945, 72 y.o.   MRN: [id number]  HPI    Review of Systems  All other systems reviewed and are negative.      Objective:   Physical Exam        Assessment & Plan:

## 2017-04-03 NOTE — Progress Notes (Signed)
   Subjective:  72 year old female presenting today for a chief complaint of pain to the anterior left ankle that began about 5 months ago. She reports associated pain to the dorsum and lateral aspect of the foot. She states the pain initially started in the left heel and arch about 8 months ago but resolved on its own. She has not done anything to treat the symptoms. Bearing weight and ambulation increases the pain. Patient is here for further evaluation and treatment.   Past Medical History:  Diagnosis Date  . Anxiety   . GERD (gastroesophageal reflux disease)   . Osteopenia    BMD as per Dr Molli Posey, [place]  . Other abnormal glucose    elevated FBS  . Other and unspecified hyperlipidemia    NMR lipoprofile 2004: LDL 169 (2322/1419), TG 130, LDL goal =<100  . Unspecified hypothyroidism      Objective / Physical Exam:  General:  The patient is alert and oriented x3 in no acute distress. Dermatology:  Skin is warm, dry and supple bilateral lower extremities. Negative for open lesions or macerations. Vascular:  Palpable pedal pulses bilaterally. No edema or erythema noted. Capillary refill within normal limits. Neurological:  Epicritic and protective threshold grossly intact bilaterally.  Musculoskeletal Exam:  Pain on palpation to the anterior lateral medial aspects of the patient's left ankle. Mild edema noted.  Range of motion within normal limits to all pedal and ankle joints bilateral. Muscle strength 5/5 in all groups bilateral.   Radiographic Exam:  Normal osseous mineralization. Joint spaces preserved. No fracture/dislocation/boney destruction.    Assessment: #1 pain in left ankle #2 synovitis of left ankle  Plan of Care:  #1 Patient was evaluated. X-Rays reviewed.  #2 injection of 0.5 mL Celestone Soluspan injected in the patient's left ankle. #3 Ankle brace dispensed.  #4 Recommended good walking shoes.  #5 Patient has a h/o peptic ulcers and cannot take oral  NSAIDs.  #6 Return to clinic as needed.   Patient walks her dogs 3 miles daily.    Edrick Kins, DPM [facility]  Dr. Edrick Kins, [address]                                        [place], [place] [address]                Office [phone]  Fax [phone]

## 2017-11-06 DIAGNOSIS — H524 Presbyopia: Secondary | ICD-10-CM | POA: Diagnosis not present

## 2017-11-06 DIAGNOSIS — Z0100 Encounter for examination of eyes and vision without abnormal findings: Secondary | ICD-10-CM | POA: Diagnosis not present

## 2017-12-26 NOTE — Patient Instructions (Addendum)
Tests ordered today. Your results will be released to [place] (or called to you) after review, usually within 72hours after test completion. If any changes need to be made, you will be notified at that same time.  All other Health Maintenance issues reviewed.   All recommended immunizations and age-appropriate screenings are up-to-date or discussed.  No immunizations administered today.   Medications reviewed and updated.  Changes include :   none  Your prescription(s) have been submitted to your pharmacy. Please take as directed and contact our office if you believe you are having problem(s) with the medication(s).  Please followup in one year   Health Maintenance, Female Adopting a healthy lifestyle and getting preventive care can go a long way to promote health and wellness. Talk with your health care provider about what schedule of regular examinations is right for you. This is a good chance for you to check in with your provider about disease prevention and staying healthy. In between checkups, there are plenty of things you can do on your own. Experts have done a lot of research about which lifestyle changes and preventive measures are most likely to keep you healthy. Ask your health care provider for more information. Weight and diet Eat a healthy diet  Be sure to include plenty of vegetables, fruits, low-fat dairy products, and lean protein.  Do not eat a lot of foods high in solid fats, added sugars, or salt.  Get regular exercise. This is one of the most important things you can do for your health. ? Most adults should exercise for at least 150 minutes each week. The exercise should increase your heart rate and make you sweat (moderate-intensity exercise). ? Most adults should also do strengthening exercises at least twice a week. This is in addition to the moderate-intensity exercise.  Maintain a healthy weight  Body mass index (BMI) is a measurement that can be used to  identify possible weight problems. It estimates body fat based on height and weight. Your health care provider can help determine your BMI and help you achieve or maintain a healthy weight.  For females 21 years of age and older: ? A BMI below 18.5 is considered underweight. ? A BMI of 18.5 to 24.9 is normal. ? A BMI of 25 to 29.9 is considered overweight. ? A BMI of 30 and above is considered obese.  Watch levels of cholesterol and blood lipids  You should start having your blood tested for lipids and cholesterol at 72 years of age, then have this test every 5 years.  You may need to have your cholesterol levels checked more often if: ? Your lipid or cholesterol levels are high. ? You are older than 72 years of age. ? You are at high risk for heart disease.  Cancer screening Lung Cancer  Lung cancer screening is recommended for adults 75-35 years old who are at high risk for lung cancer because of a history of smoking.  A yearly low-dose CT scan of the lungs is recommended for people who: ? Currently smoke. ? Have quit within the past 15 years. ? Have at least a 30-pack-year history of smoking. A pack year is smoking an average of one pack of cigarettes a day for 1 year.  Yearly screening should continue until it has been 15 years since you quit.  Yearly screening should stop if you develop a health problem that would prevent you from having lung cancer treatment.  Breast Cancer  Practice breast self-awareness. This  This means understanding how your breasts normally appear and feel.  It also means doing regular breast self-exams. Let your health care provider know about any changes, no matter how small.  If you are in your 20s or 30s, you should have a clinical breast exam (CBE) by a health care provider every 1-3 years as part of a regular health exam.  If you are 40 or older, have a CBE every year. Also consider having a breast X-ray (mammogram) every year.  If you have a  family history of breast cancer, talk to your health care provider about genetic screening.  If you are at high risk for breast cancer, talk to your health care provider about having an MRI and a mammogram every year.  Breast cancer gene (BRCA) assessment is recommended for women who have family members with BRCA-related cancers. BRCA-related cancers include: ? Breast. ? Ovarian. ? Tubal. ? Peritoneal cancers.  Results of the assessment will determine the need for genetic counseling and BRCA1 and BRCA2 testing.  Cervical Cancer Your health care provider may recommend that you be screened regularly for cancer of the pelvic organs (ovaries, uterus, and vagina). This screening involves a pelvic examination, including checking for microscopic changes to the surface of your cervix (Pap test). You may be encouraged to have this screening done every 3 years, beginning at age 21.  For women ages 30-65, health care providers may recommend pelvic exams and Pap testing every 3 years, or they may recommend the Pap and pelvic exam, combined with testing for human papilloma virus (HPV), every 5 years. Some types of HPV increase your risk of cervical cancer. Testing for HPV may also be done on women of any age with unclear Pap test results.  Other health care providers may not recommend any screening for nonpregnant women who are considered low risk for pelvic cancer and who do not have symptoms. Ask your health care provider if a screening pelvic exam is right for you.  If you have had past treatment for cervical cancer or a condition that could lead to cancer, you need Pap tests and screening for cancer for at least 20 years after your treatment. If Pap tests have been discontinued, your risk factors (such as having a new sexual partner) need to be reassessed to determine if screening should resume. Some women have medical problems that increase the chance of getting cervical cancer. In these cases, your  health care provider may recommend more frequent screening and Pap tests.  Colorectal Cancer  This type of cancer can be detected and often prevented.  Routine colorectal cancer screening usually begins at 72 years of age and continues through 72 years of age.  Your health care provider may recommend screening at an earlier age if you have risk factors for colon cancer.  Your health care provider may also recommend using home test kits to check for hidden blood in the stool.  A small camera at the end of a tube can be used to examine your colon directly (sigmoidoscopy or colonoscopy). This is done to check for the earliest forms of colorectal cancer.  Routine screening usually begins at age 50.  Direct examination of the colon should be repeated every 5-10 years through 72 years of age. However, you may need to be screened more often if early forms of precancerous polyps or small growths are found.  Skin Cancer  Check your skin from head to toe regularly.  Tell your health care provider about any   new moles or changes in moles, especially if there is a change in a mole's shape or color.  Also tell your health care provider if you have a mole that is larger than the size of a pencil eraser.  Always use sunscreen. Apply sunscreen liberally and repeatedly throughout the day.  Protect yourself by wearing long sleeves, pants, a wide-brimmed hat, and sunglasses whenever you are outside.  Heart disease, diabetes, and high blood pressure  High blood pressure causes heart disease and increases the risk of stroke. High blood pressure is more likely to develop in: ? People who have blood pressure in the high end of the normal range (130-139/85-89 mm Hg). ? People who are overweight or obese. ? People who are African American.  If you are 18-39 years of age, have your blood pressure checked every 3-5 years. If you are 40 years of age or older, have your blood pressure checked every year. You  should have your blood pressure measured twice-once when you are at a hospital or clinic, and once when you are not at a hospital or clinic. Record the average of the two measurements. To check your blood pressure when you are not at a hospital or clinic, you can use: ? An automated blood pressure machine at a pharmacy. ? A home blood pressure monitor.  If you are between 55 years and 79 years old, ask your health care provider if you should take aspirin to prevent strokes.  Have regular diabetes screenings. This involves taking a blood sample to check your fasting blood sugar level. ? If you are at a normal weight and have a low risk for diabetes, have this test once every three years after 72 years of age. ? If you are overweight and have a high risk for diabetes, consider being tested at a younger age or more often. Preventing infection Hepatitis B  If you have a higher risk for hepatitis B, you should be screened for this virus. You are considered at high risk for hepatitis B if: ? You were born in a country where hepatitis B is common. Ask your health care provider which countries are considered high risk. ? Your parents were born in a high-risk country, and you have not been immunized against hepatitis B (hepatitis B vaccine). ? You have HIV or AIDS. ? You use needles to inject street drugs. ? You live with someone who has hepatitis B. ? You have had sex with someone who has hepatitis B. ? You get hemodialysis treatment. ? You take certain medicines for conditions, including cancer, organ transplantation, and autoimmune conditions.  Hepatitis C  Blood testing is recommended for: ? Everyone born from 1945 through 1965. ? Anyone with known risk factors for hepatitis C.  Sexually transmitted infections (STIs)  You should be screened for sexually transmitted infections (STIs) including gonorrhea and chlamydia if: ? You are sexually active and are younger than 72 years of age. ? You  are older than 72 years of age and your health care provider tells you that you are at risk for this type of infection. ? Your sexual activity has changed since you were last screened and you are at an increased risk for chlamydia or gonorrhea. Ask your health care provider if you are at risk.  If you do not have HIV, but are at risk, it may be recommended that you take a prescription medicine daily to prevent HIV infection. This is called pre-exposure prophylaxis (PrEP). You are considered at   if: ? You are sexually active and do not regularly use condoms or know the HIV status of your partner(s). ? You take drugs by injection. ? You are sexually active with a partner who has HIV.  Talk with your health care provider about whether you are at high risk of being infected with HIV. If you choose to begin PrEP, you should first be tested for HIV. You should then be tested every 3 months for as long as you are taking PrEP. Pregnancy  If you are premenopausal and you may become pregnant, ask your health care provider about preconception counseling.  If you may become pregnant, take 400 to 800 micrograms (mcg) of folic acid every day.  If you want to prevent pregnancy, talk to your health care provider about birth control (contraception). Osteoporosis and menopause  Osteoporosis is a disease in which the bones lose minerals and strength with aging. This can result in serious bone fractures. Your risk for osteoporosis can be identified using a bone density scan.  If you are 19 years of age or older, or if you are at risk for osteoporosis and fractures, ask your health care provider if you should be screened.  Ask your health care provider whether you should take a calcium or vitamin D supplement to lower your risk for osteoporosis.  Menopause may have certain physical symptoms and risks.  Hormone replacement therapy may reduce some of these symptoms and risks. Talk to your health care  provider about whether hormone replacement therapy is right for you. Follow these instructions at home:  Schedule regular health, dental, and eye exams.  Stay current with your immunizations.  Do not use any tobacco products including cigarettes, chewing tobacco, or electronic cigarettes.  If you are pregnant, do not drink alcohol.  If you are breastfeeding, limit how much and how often you drink alcohol.  Limit alcohol intake to no more than 1 drink per day for nonpregnant women. One drink equals 12 ounces of beer, 5 ounces of wine, or 1 ounces of hard liquor.  Do not use street drugs.  Do not share needles.  Ask your health care provider for help if you need support or information about quitting drugs.  Tell your health care provider if you often feel depressed.  Tell your health care provider if you have ever been abused or do not feel safe at home. This information is not intended to replace advice given to you by your health care provider. Make sure you discuss any questions you have with your health care provider. Document Released: 07/25/2010 Document Revised: 06/17/2015 Document Reviewed: 10/13/2014 Elsevier Interactive Patient Education  [employer].

## 2017-12-26 NOTE — Progress Notes (Signed)
Subjective:    Patient ID: Amy Tucker, female    DOB: [birth year], 72 y.o.   MRN: [id number]  HPI She is here for a physical exam.     Medications and allergies reviewed with patient and updated if appropriate.  Patient Active Problem List   Diagnosis Date Noted  . Anxiety 02/12/2015  . GERD without esophagitis 10/27/2013  . Hyperlipidemia 08/02/2007  . Osteopenia 08/02/2007  . Hypothyroidism 07/31/2006  . Hyperglycemia 07/31/2006    Current Outpatient Medications on File Prior to Visit  Medication Sig Dispense Refill  . ALPRAZolam (XANAX) 0.25 MG tablet TAKE ONE-HALF TABLET BY MOUTH EVERY 8 HOURS AS NEEDED 30 tablet 2  . Cholecalciferol (VITAMIN D) 2000 units CAPS Take by mouth.    . ELDERBERRY PO Take by mouth.    . NON FORMULARY     . omeprazole (PRILOSEC OTC) 20 MG tablet Take 20 mg by mouth daily as needed.     . pravastatin (PRAVACHOL) 40 MG tablet Take 1 tablet (40 mg total) by mouth daily. 90 tablet 3  . Probiotic Product ([employer] ADV PROBIOTIC GUMMIES PO) Take by mouth daily.    [name] SYNTHROID 75 MCG tablet Take 1 tablet by mouth daily except on Wed, take 1.5 tablets 102 tablet 3   No current facility-administered medications on file prior to visit.     Past Medical History:  Diagnosis Date  . Anxiety   . GERD (gastroesophageal reflux disease)   . Osteopenia    BMD as per Dr Molli Posey, [place]  . Other abnormal glucose    elevated FBS  . Other and unspecified hyperlipidemia    NMR lipoprofile 2004: LDL 169 (2322/1419), TG 130, LDL goal =<100  . Unspecified hypothyroidism     Past Surgical History:  Procedure Laterality Date  . DILATION AND CURETTAGE OF UTERUS  1987  . EXTERNAL FIXATION ANKLE FRACTURE  1997   post fall (no surgery)  . no colonoscopy     "I don't want to be put to sleep" [facility] reviewed)  . TONSILLECTOMY AND ADENOIDECTOMY  1953    Social History   Socioeconomic History  . Marital status: Married    Spouse name: Not on file  .  Number of children: Not on file  . Years of education: Not on file  . Highest education level: Not on file  Occupational History  . Occupation: Theme park manager: [employer]  . Financial resource strain: Not on file  . Food insecurity:    Worry: Not on file    Inability: Not on file  . Transportation needs:    Medical: Not on file    Non-medical: Not on file  Tobacco Use  . Smoking status: Former Smoker    Packs/day: 2.50    Years: 20.00    Pack years: 50.00    Types: Cigarettes    Last attempt to quit: 01/23/1990    Years since quitting: 27.9  . Smokeless tobacco: Never Used  . Tobacco comment: pt smoked from age [age]  (1973- 1992),up to 2 ppd  Substance and Sexual Activity  . Alcohol use: Yes    Comment: Rarely  . Drug use: No  . Sexual activity: Not on file  Lifestyle  . Physical activity:    Days per week: Not on file    Minutes per session: Not on file  . Stress: Not on file  Relationships  . Social connections:    Talks  on phone: Not on file    Gets together: Not on file    Attends religious service: Not on file    Active member of club or organization: Not on file    Attends meetings of clubs or organizations: Not on file    Relationship status: Not on file  Other Topics Concern  . Not on file  Social History Narrative   Regular exercise- walks dog 2/day - total 45 min   Low fat, carb       Family History  Problem Relation Age of Onset  . Thyroid disease Maternal Aunt        goiter  . Diabetes Unknown        54 M uncles & M aunts  . Leukemia Father   . Diabetes Mother   . Hypertension Mother   . Coronary artery disease Mother   . Heart attack Mother 81  . Stroke Mother        > 59  . Rheum arthritis Mother   . Kidney failure Mother   . Hypertension Brother   . Other Paternal Uncle   . Alzheimer's disease Paternal Aunt   . Cirrhosis Paternal Uncle   . Heart attack Maternal Uncle 55  . Colon cancer Neg Hx      Review of Systems  Constitutional: Negative for chills and fever.  Eyes: Negative for visual disturbance.  Respiratory: Negative for cough, shortness of breath and wheezing.   Cardiovascular: Negative for chest pain, palpitations and leg swelling.  Gastrointestinal: Negative for abdominal pain, blood in stool, constipation, diarrhea and nausea.       Occ GERD  Genitourinary: Negative for dysuria and hematuria.  Musculoskeletal: Positive for arthralgias (Left hip, stiffness). Negative for back pain.  Skin: Negative for color change and rash.  Neurological: Positive for headaches (occasional). Negative for dizziness and light-headedness.  Psychiatric/Behavioral: Negative for dysphoric mood. The patient is nervous/anxious.        Objective:   Vitals:   12/27/17 0801  BP: 122/76  Pulse: (!) 59  Temp: 97.6 F (36.4 C)  SpO2: 99%   Filed Weights   12/27/17 0801  Weight: 137 lb (62.1 kg)   Body mass index is 22.45 kg/m.  BP Readings from Last 3 Encounters:  12/27/17 122/76  12/25/16 136/84  02/24/16 134/82    Wt Readings from Last 3 Encounters:  12/27/17 137 lb (62.1 kg)  12/25/16 137 lb (62.1 kg)  02/24/16 139 lb 8 oz (63.3 kg)     Physical Exam Constitutional: She appears well-developed and well-nourished. No distress.  HENT:  Head: Normocephalic and atraumatic.  Right Ear: External ear normal. Normal ear canal and TM Left Ear: External ear normal.  Normal ear canal and TM Mouth/Throat: Oropharynx is clear and moist.  Eyes: Conjunctivae and EOM are normal.  Neck: Neck supple. No tracheal deviation present. No thyromegaly present.  No carotid bruit  Cardiovascular: Normal rate, regular rhythm and normal heart sounds.   No murmur heard.  No edema. Pulmonary/Chest: Effort normal and breath sounds normal. No respiratory distress. She has no wheezes. She has no rales.  Breast: deferred to Gyn Abdominal: Soft. She exhibits no distension. There is no tenderness.   Lymphadenopathy: She has no cervical adenopathy.  Skin: Skin is warm and dry. She is not diaphoretic. Marks from heating pad on lower abdomen - nontender Psychiatric: She has a normal mood and affect. Her behavior is normal.        Assessment & Plan:  Physical exam: Screening blood work  ordered Immunizations   Flu vaccine postponed, deferred shingrix, deferred prevnar Colonoscopy    Up to date  Mammogram   Will schedule Dexa   Last done 10/2015 - osteopenia  - due Eye exams   Up to date  EKG  Done 2014 Exercise  Walks regularly - walks dog - gets 10,000 steps a day Weight   Normal BMI Skin   No concerns Substance abuse   none  See Problem List for Assessment and Plan of chronic medical problems.    FU in 1 year

## 2017-12-26 NOTE — Assessment & Plan Note (Addendum)
dexa due - ordered Taking vitamin d daily Not taking any calcium  Exercising regularly - walks a lot

## 2017-12-27 ENCOUNTER — Other Ambulatory Visit (INDEPENDENT_AMBULATORY_CARE_PROVIDER_SITE_OTHER): Payer: Medicare HMO

## 2017-12-27 ENCOUNTER — Ambulatory Visit (INDEPENDENT_AMBULATORY_CARE_PROVIDER_SITE_OTHER): Payer: Medicare HMO | Admitting: Internal Medicine

## 2017-12-27 ENCOUNTER — Encounter: Payer: Self-pay | Admitting: Internal Medicine

## 2017-12-27 ENCOUNTER — Ambulatory Visit (INDEPENDENT_AMBULATORY_CARE_PROVIDER_SITE_OTHER)
Admission: RE | Admit: 2017-12-27 | Discharge: 2017-12-27 | Disposition: A | Discharge status: Home / Self Care | Payer: Medicare HMO | Source: Ambulatory Visit | Attending: Internal Medicine | Admitting: Internal Medicine

## 2017-12-27 VITALS — BP 122/76 | HR 59 | Temp 97.6°F | Wt 137.0 lb

## 2017-12-27 DIAGNOSIS — E038 Other specified hypothyroidism: Secondary | ICD-10-CM | POA: Diagnosis not present

## 2017-12-27 DIAGNOSIS — Z13820 Encounter for screening for osteoporosis: Secondary | ICD-10-CM | POA: Diagnosis not present

## 2017-12-27 DIAGNOSIS — M85851 Other specified disorders of bone density and structure, right thigh: Secondary | ICD-10-CM

## 2017-12-27 DIAGNOSIS — E785 Hyperlipidemia, unspecified: Secondary | ICD-10-CM | POA: Diagnosis not present

## 2017-12-27 DIAGNOSIS — Z0000 Encounter for general adult medical examination without abnormal findings: Secondary | ICD-10-CM

## 2017-12-27 DIAGNOSIS — F419 Anxiety disorder, unspecified: Secondary | ICD-10-CM | POA: Diagnosis not present

## 2017-12-27 DIAGNOSIS — R739 Hyperglycemia, unspecified: Secondary | ICD-10-CM

## 2017-12-27 DIAGNOSIS — K219 Gastro-esophageal reflux disease without esophagitis: Secondary | ICD-10-CM

## 2017-12-27 LAB — CBC WITH DIFFERENTIAL/PLATELET
Basophils Absolute: 0 10*3/uL (ref 0.0–0.1)
Basophils Relative: 0.7 % (ref 0.0–3.0)
EOS PCT: 2.5 % (ref 0.0–5.0)
Eosinophils Absolute: 0.1 10*3/uL (ref 0.0–0.7)
HCT: 45.4 % (ref 36.0–46.0)
Hemoglobin: 15.2 g/dL — ABNORMAL HIGH (ref 12.0–15.0)
Lymphocytes Relative: 27.3 % (ref 12.0–46.0)
Lymphs Abs: 1.3 10*3/uL (ref 0.7–4.0)
MCHC: 33.5 g/dL (ref 30.0–36.0)
MCV: 90.7 fl (ref 78.0–100.0)
Monocytes Absolute: 0.4 10*3/uL (ref 0.1–1.0)
Monocytes Relative: 8.4 % (ref 3.0–12.0)
Neutro Abs: 3 10*3/uL (ref 1.4–7.7)
Neutrophils Relative %: 61.1 % (ref 43.0–77.0)
PLATELETS: 239 10*3/uL (ref 150.0–400.0)
RBC: 5.01 Mil/uL (ref 3.87–5.11)
RDW: 13.8 % (ref 11.5–15.5)
WBC: 4.9 10*3/uL (ref 4.0–10.5)

## 2017-12-27 LAB — COMPREHENSIVE METABOLIC PANEL
ALT: 11 U/L (ref 0–35)
AST: 16 U/L (ref 0–37)
Albumin: 4.2 g/dL (ref 3.5–5.2)
Alkaline Phosphatase: 82 U/L (ref 39–117)
BUN: 12 mg/dL (ref 6–23)
CO2: 30 meq/L (ref 19–32)
Calcium: 9.4 mg/dL (ref 8.4–10.5)
Chloride: 102 mEq/L (ref 96–112)
Creatinine, Ser: 0.82 mg/dL (ref 0.40–1.20)
GFR: 72.67 mL/min (ref >60.00)
Glucose, Bld: 92 mg/dL (ref 70–99)
Potassium: 4.7 mEq/L (ref 3.5–5.1)
Sodium: 138 mEq/L (ref 135–145)
Total Bilirubin: 0.6 mg/dL (ref 0.2–1.2)
Total Protein: 7 g/dL (ref 6.0–8.3)

## 2017-12-27 LAB — LIPID PANEL
CHOL/HDL RATIO: 4
Cholesterol: 207 mg/dL — ABNORMAL HIGH (ref 0–200)
HDL: 50 mg/dL (ref >39.00)
LDL Cholesterol: 129 mg/dL — ABNORMAL HIGH (ref 0–99)
NonHDL: 156.64
Triglycerides: 137 mg/dL (ref 0.0–149.0)
VLDL: 27.4 mg/dL (ref 0.0–40.0)

## 2017-12-27 LAB — HEMOGLOBIN A1C: Hgb A1c MFr Bld: 5.9 % (ref 4.6–6.5)

## 2017-12-27 LAB — TSH: TSH: 2.34 u[IU]/mL (ref 0.35–4.50)

## 2017-12-27 MED ORDER — ALPRAZOLAM 0.25 MG PO TABS: ORAL_TABLET | ORAL | 2 refills | Status: DC

## 2017-12-27 NOTE — Assessment & Plan Note (Signed)
Clinically euthyroid Check tsh  Titrate med dose if needed  

## 2017-12-27 NOTE — Assessment & Plan Note (Signed)
Check lipid panel, cmp Continue daily statin Regular exercise and healthy diet encouraged  

## 2017-12-27 NOTE — Assessment & Plan Note (Signed)
occ GERD with certain foods Take pepcid prn

## 2017-12-27 NOTE — Assessment & Plan Note (Signed)
Intermittent Takes xanax as needed, which works well Controlled, stable Continue current dose of medication - refilled

## 2017-12-27 NOTE — Assessment & Plan Note (Signed)
a1c

## 2018-01-21 ENCOUNTER — Other Ambulatory Visit: Payer: Self-pay | Admitting: Internal Medicine

## 2018-01-24 ENCOUNTER — Telehealth: Payer: Self-pay

## 2018-01-24 ENCOUNTER — Other Ambulatory Visit: Payer: Self-pay | Admitting: Internal Medicine

## 2018-01-24 NOTE — Telephone Encounter (Signed)
Called patient and scheduled appointment for tomorrow.

## 2018-01-24 NOTE — Telephone Encounter (Signed)
Should be seen - they typically like a physical exam to be done prior to referral

## 2018-01-24 NOTE — Patient Instructions (Addendum)
A diagnostic mammogram was ordered.  An ultrasound was also ordered.  The breast center will contact you to schedule this.

## 2018-01-24 NOTE — Telephone Encounter (Signed)
Would this need an OV?

## 2018-01-24 NOTE — Telephone Encounter (Signed)
Copied from [place] [id number]. Topic: Referral - Request for Referral >> Jan 24, 2018  1:25 PM Yvette Rack wrote: Has patient seen PCP for this complaint? No. Pt found a lump yesterday and the [facility] advised her to call provider for a referral *If NO, is insurance requiring patient see PCP for this issue before PCP can refer them? Referral for which specialty: diagnostic mammogram  Preferred provider/office: [facility] in [place] [phone] Reason for referral: lt breast lump

## 2018-01-24 NOTE — Telephone Encounter (Signed)
Will you please set up an OV for pt for this issue. Thank you.

## 2018-01-24 NOTE — Progress Notes (Signed)
Subjective:    Patient ID: Amy Tucker, female    DOB: [birth year], 73 y.o.   MRN: [id number]  HPI The patient is here for an acute visit.   Breast lump: She noticed a lump in her left breast New Year's Eve, 3 nights ago.  She had [place] there and itch her left breast and that is how she noticed the lump.  She denies any pain or discomfort.  She denies any skin changes.  Her last mammogram was November 2017 at the breast center.  She does have dense breasts and in her 51s did have some fibrocystic disease, but never had any cysts or other abnormalities.  She denies any known family history of breast cancer, but does not know her paternal history.   Medications and allergies reviewed with patient and updated if appropriate.  Patient Active Problem List   Diagnosis Date Noted  . Breast lump 01/25/2018  . Anxiety 02/12/2015  . GERD without esophagitis 10/27/2013  . Hyperlipidemia 08/02/2007  . Osteopenia 08/02/2007  . Hypothyroidism 07/31/2006  . Hyperglycemia 07/31/2006    Current Outpatient Medications on File Prior to Visit  Medication Sig Dispense Refill  . ALPRAZolam (XANAX) 0.25 MG tablet TAKE ONE-HALF TABLET BY MOUTH EVERY 8 HOURS AS NEEDED 30 tablet 2  . Cholecalciferol (VITAMIN D) 2000 units CAPS Take by mouth.    . ELDERBERRY PO Take by mouth.    . NON FORMULARY     . pravastatin (PRAVACHOL) 40 MG tablet TAKE 1 TABLET BY MOUTH ONCE DAILY 90 tablet 3  . Probiotic Product ([employer] ADV PROBIOTIC GUMMIES PO) Take by mouth daily.    [name] SYNTHROID 75 MCG tablet TAKE 1 TABLET BY MOUTH ONCE DAILY EXCEPT  ON  WEDNESDAY  TAKE  1.5  TABLETS 102 tablet 3   No current facility-administered medications on file prior to visit.     Past Medical History:  Diagnosis Date  . Anxiety   . GERD (gastroesophageal reflux disease)   . Osteopenia    BMD as per Dr Molli Posey, [place]  . Other abnormal glucose    elevated FBS  . Other and unspecified hyperlipidemia    NMR lipoprofile 2004:  LDL 169 (2322/1419), TG 130, LDL goal =<100  . Unspecified hypothyroidism     Past Surgical History:  Procedure Laterality Date  . DILATION AND CURETTAGE OF UTERUS  1987  . EXTERNAL FIXATION ANKLE FRACTURE  1997   post fall (no surgery)  . no colonoscopy     "I don't want to be put to sleep" [facility] reviewed)  . TONSILLECTOMY AND ADENOIDECTOMY  1953    Social History   Socioeconomic History  . Marital status: Married    Spouse name: Not on file  . Number of children: Not on file  . Years of education: Not on file  . Highest education level: Not on file  Occupational History  . Occupation: Theme park manager: [employer]  . Financial resource strain: Not on file  . Food insecurity:    Worry: Not on file    Inability: Not on file  . Transportation needs:    Medical: Not on file    Non-medical: Not on file  Tobacco Use  . Smoking status: Former Smoker    Packs/day: 2.50    Years: 20.00    Pack years: 50.00    Types: Cigarettes    Last attempt to quit: 01/23/1990    Years since quitting:  28.0  . Smokeless tobacco: Never Used  . Tobacco comment: pt smoked from age [age]  (1973- 1992),up to 2 ppd  Substance and Sexual Activity  . Alcohol use: Yes    Comment: Rarely  . Drug use: No  . Sexual activity: Not on file  Lifestyle  . Physical activity:    Days per week: Not on file    Minutes per session: Not on file  . Stress: Not on file  Relationships  . Social connections:    Talks on phone: Not on file    Gets together: Not on file    Attends religious service: Not on file    Active member of club or organization: Not on file    Attends meetings of clubs or organizations: Not on file    Relationship status: Not on file  Other Topics Concern  . Not on file  Social History Narrative   Regular exercise- walks dog 2/day - total 45 min   Low fat, carb       Family History  Problem Relation Age of Onset  . Thyroid disease Maternal Aunt         goiter  . Diabetes Unknown        1 M uncles & M aunts  . Leukemia Father   . Diabetes Mother   . Hypertension Mother   . Coronary artery disease Mother   . Heart attack Mother 64  . Stroke Mother        > 48  . Rheum arthritis Mother   . Kidney failure Mother   . Hypertension Brother   . Other Paternal Uncle   . Alzheimer's disease Paternal Aunt   . Cirrhosis Paternal Uncle   . Heart attack Maternal Uncle 55  . Colon cancer Neg Hx     Review of Systems Per HPI    Objective:   Vitals:   01/25/18 1520  BP: 134/88  Pulse: 61  Resp: 16  Temp: 98.4 F (36.9 C)  SpO2: 99%   BP Readings from Last 3 Encounters:  01/25/18 134/88  12/27/17 122/76  12/25/16 136/84   Wt Readings from Last 3 Encounters:  01/25/18 135 lb 12.8 oz (61.6 kg)  12/27/17 137 lb (62.1 kg)  12/25/16 137 lb (62.1 kg)   Body mass index is 22.25 kg/m.   Physical Exam Constitutional:      General: She is not in acute distress.    Appearance: Normal appearance. She is not ill-appearing.  Chest:     Breasts: Breasts are symmetrical.        Right: No swelling, mass, nipple discharge, skin change or tenderness.        Left: Mass (Palpable lump-size of a grape 2:00) present. No swelling, nipple discharge, skin change or tenderness.  Lymphadenopathy:     Upper Body:     Right upper body: No axillary adenopathy.     Left upper body: No axillary adenopathy.  Skin:    General: Skin is warm and dry.     Findings: No erythema or rash.  Neurological:     Mental Status: She is alert.            Assessment & Plan:    See Problem List for Assessment and Plan of chronic medical problems.

## 2018-01-25 ENCOUNTER — Ambulatory Visit (INDEPENDENT_AMBULATORY_CARE_PROVIDER_SITE_OTHER): Payer: Medicare HMO | Admitting: Internal Medicine

## 2018-01-25 ENCOUNTER — Encounter: Payer: Self-pay | Admitting: Internal Medicine

## 2018-01-25 VITALS — BP 134/88 | HR 61 | Temp 98.4°F | Resp 16 | Ht 65.5 in | Wt 135.8 lb

## 2018-01-25 DIAGNOSIS — N63 Unspecified lump in unspecified breast: Secondary | ICD-10-CM | POA: Insufficient documentation

## 2018-01-25 NOTE — Assessment & Plan Note (Signed)
Palpable, nontender left breast lump at 2:00 She noticed this 3 nights ago No other breast changes or concerns on exam Last mammogram November 2017 History of dense breasts, fibrocystic disease No known family history of breast cancer Stat diagnostic bilateral mammogram ordered Stat breast ultrasound left breast ordered

## 2018-01-30 ENCOUNTER — Other Ambulatory Visit: Payer: Self-pay | Admitting: Internal Medicine

## 2018-01-30 ENCOUNTER — Ambulatory Visit
Admission: RE | Admit: 2018-01-30 | Discharge: 2018-01-30 | Disposition: A | Discharge status: Home / Self Care | Payer: Medicare HMO | Source: Ambulatory Visit | Attending: Internal Medicine | Admitting: Internal Medicine

## 2018-01-30 DIAGNOSIS — N63 Unspecified lump in unspecified breast: Secondary | ICD-10-CM

## 2018-01-30 DIAGNOSIS — R928 Other abnormal and inconclusive findings on diagnostic imaging of breast: Secondary | ICD-10-CM | POA: Diagnosis not present

## 2018-01-30 DIAGNOSIS — N6321 Unspecified lump in the left breast, upper outer quadrant: Secondary | ICD-10-CM | POA: Diagnosis not present

## 2018-03-13 ENCOUNTER — Other Ambulatory Visit: Payer: Self-pay | Admitting: Internal Medicine

## 2018-03-13 ENCOUNTER — Ambulatory Visit
Admission: RE | Admit: 2018-03-13 | Discharge: 2018-03-13 | Disposition: A | Discharge status: Home / Self Care | Payer: Medicare HMO | Source: Ambulatory Visit | Attending: Internal Medicine | Admitting: Internal Medicine

## 2018-03-13 DIAGNOSIS — N6002 Solitary cyst of left breast: Secondary | ICD-10-CM | POA: Diagnosis not present

## 2018-03-13 DIAGNOSIS — N63 Unspecified lump in unspecified breast: Secondary | ICD-10-CM

## 2018-09-09 ENCOUNTER — Other Ambulatory Visit: Payer: Self-pay | Admitting: Internal Medicine

## 2018-09-09 NOTE — Telephone Encounter (Signed)
Last OV 12/27/17 Next OV 12/30/18 Last RF 05/09/18

## 2018-09-12 ENCOUNTER — Ambulatory Visit
Admission: RE | Admit: 2018-09-12 | Discharge: 2018-09-12 | Disposition: A | Discharge status: Home / Self Care | Payer: Medicare HMO | Source: Ambulatory Visit | Attending: Internal Medicine | Admitting: Internal Medicine

## 2018-09-12 ENCOUNTER — Other Ambulatory Visit: Payer: Self-pay

## 2018-09-12 DIAGNOSIS — N63 Unspecified lump in unspecified breast: Secondary | ICD-10-CM

## 2018-09-12 DIAGNOSIS — N6002 Solitary cyst of left breast: Secondary | ICD-10-CM | POA: Diagnosis not present

## 2018-09-12 DIAGNOSIS — N641 Fat necrosis of breast: Secondary | ICD-10-CM | POA: Diagnosis not present

## 2018-09-12 DIAGNOSIS — N6321 Unspecified lump in the left breast, upper outer quadrant: Secondary | ICD-10-CM | POA: Diagnosis not present

## 2018-12-04 ENCOUNTER — Other Ambulatory Visit: Payer: Self-pay | Admitting: Internal Medicine

## 2018-12-04 NOTE — Telephone Encounter (Signed)
Last OV 12/27/17 Next OV 12/30/18 Last RF 09/09/18

## 2018-12-29 NOTE — Patient Instructions (Addendum)
Tests ordered today. Your results will be released to [facility] (or called to you) after review.  If any changes need to be made, you will be notified at that same time.  All other Health Maintenance issues reviewed.   All recommended immunizations and age-appropriate screenings are up-to-date or discussed.  No immunization administered today.   Medications reviewed and updated.  Changes include :   none  Your prescription(s) have been submitted to your pharmacy. Please take as directed and contact our office if you believe you are having problem(s) with the medication(s).   Please followup in 1 year   Health Maintenance, Female Adopting a healthy lifestyle and getting preventive care are important in promoting health and wellness. Ask your health care provider about:  The right schedule for you to have regular tests and exams.  Things you can do on your own to prevent diseases and keep yourself healthy. What should I know about diet, weight, and exercise? Eat a healthy diet   Eat a diet that includes plenty of vegetables, fruits, low-fat dairy products, and lean protein.  Do not eat a lot of foods that are high in solid fats, added sugars, or sodium. Maintain a healthy weight Body mass index (BMI) is used to identify weight problems. It estimates body fat based on height and weight. Your health care provider can help determine your BMI and help you achieve or maintain a healthy weight. Get regular exercise Get regular exercise. This is one of the most important things you can do for your health. Most adults should:  Exercise for at least 150 minutes each week. The exercise should increase your heart rate and make you sweat (moderate-intensity exercise).  Do strengthening exercises at least twice a week. This is in addition to the moderate-intensity exercise.  Spend less time sitting. Even light physical activity can be beneficial. Watch cholesterol and blood lipids Have your  blood tested for lipids and cholesterol at 73 years of age, then have this test every 5 years. Have your cholesterol levels checked more often if:  Your lipid or cholesterol levels are high.  You are older than 73 years of age.  You are at high risk for heart disease. What should I know about cancer screening? Depending on your health history and family history, you may need to have cancer screening at various ages. This may include screening for:  Breast cancer.  Cervical cancer.  Colorectal cancer.  Skin cancer.  Lung cancer. What should I know about heart disease, diabetes, and high blood pressure? Blood pressure and heart disease  High blood pressure causes heart disease and increases the risk of stroke. This is more likely to develop in people who have high blood pressure readings, are of African descent, or are overweight.  Have your blood pressure checked: ? Every 3-5 years if you are 44-45 years of age. ? Every year if you are 44 years old or older. Diabetes Have regular diabetes screenings. This checks your fasting blood sugar level. Have the screening done:  Once every three years after age 11 if you are at a normal weight and have a low risk for diabetes.  More often and at a younger age if you are overweight or have a high risk for diabetes. What should I know about preventing infection? Hepatitis B If you have a higher risk for hepatitis B, you should be screened for this virus. Talk with your health care provider to find out if you are at risk for  hepatitis B infection. Hepatitis C Testing is recommended for:  Everyone born from [birth year] through 1965.  Anyone with known risk factors for hepatitis C. Sexually transmitted infections (STIs)  Get screened for STIs, including gonorrhea and chlamydia, if: ? You are sexually active and are younger than 73 years of age. ? You are older than 73 years of age and your health care provider tells you that you are at risk  for this type of infection. ? Your sexual activity has changed since you were last screened, and you are at increased risk for chlamydia or gonorrhea. Ask your health care provider if you are at risk.  Ask your health care provider about whether you are at high risk for HIV. Your health care provider may recommend a prescription medicine to help prevent HIV infection. If you choose to take medicine to prevent HIV, you should first get tested for HIV. You should then be tested every 3 months for as long as you are taking the medicine. Pregnancy  If you are about to stop having your period (premenopausal) and you may become pregnant, seek counseling before you get pregnant.  Take 400 to 800 micrograms (mcg) of folic acid every day if you become pregnant.  Ask for birth control (contraception) if you want to prevent pregnancy. Osteoporosis and menopause Osteoporosis is a disease in which the bones lose minerals and strength with aging. This can result in bone fractures. If you are 60 years old or older, or if you are at risk for osteoporosis and fractures, ask your health care provider if you should:  Be screened for bone loss.  Take a calcium or vitamin D supplement to lower your risk of fractures.  Be given hormone replacement therapy (HRT) to treat symptoms of menopause. Follow these instructions at home: Lifestyle  Do not use any products that contain nicotine or tobacco, such as cigarettes, e-cigarettes, and chewing tobacco. If you need help quitting, ask your health care provider.  Do not use street drugs.  Do not share needles.  Ask your health care provider for help if you need support or information about quitting drugs. Alcohol use  Do not drink alcohol if: ? Your health care provider tells you not to drink. ? You are pregnant, may be pregnant, or are planning to become pregnant.  If you drink alcohol: ? Limit how much you use to 0-1 drink a day. ? Limit intake if you are  breastfeeding.  Be aware of how much alcohol is in your drink. In the [place] one drink equals one 12 oz bottle of beer (355 mL), one 5 oz glass of wine (148 mL), or one 1 oz glass of hard liquor (44 mL). General instructions  Schedule regular health, dental, and eye exams.  Stay current with your vaccines.  Tell your health care provider if: ? You often feel depressed. ? You have ever been abused or do not feel safe at home. Summary  Adopting a healthy lifestyle and getting preventive care are important in promoting health and wellness.  Follow your health care provider's instructions about healthy diet, exercising, and getting tested or screened for diseases.  Follow your health care provider's instructions on monitoring your cholesterol and blood pressure. This information is not intended to replace advice given to you by your health care provider. Make sure you discuss any questions you have with your health care provider. Document Released: 07/25/2010 Document Revised: 01/02/2018 Document Reviewed: 01/02/2018 Elsevier Patient Education  2020 [employer].

## 2018-12-29 NOTE — Assessment & Plan Note (Addendum)
dexa up to date Taking vitamin d Walking regularly

## 2018-12-29 NOTE — Progress Notes (Addendum)
Subjective:    Patient ID: Amy Tucker, female    DOB: 1945/05/07, 73 y.o.   MRN: [id number]  HPI She is here for a physical exam.   Se has no concerns.   She denies any change in her health.   Medications and allergies reviewed with patient and updated if appropriate.  Patient Active Problem List   Diagnosis Date Noted  . Anxiety 02/12/2015  . Hyperlipidemia 08/02/2007  . Osteopenia 08/02/2007  . Hypothyroidism 07/31/2006  . Prediabetes 07/31/2006    Current Outpatient Medications on File Prior to Visit  Medication Sig Dispense Refill  . Cholecalciferol (VITAMIN D) 2000 units CAPS Take by mouth.    . ELDERBERRY PO Take by mouth.    . NON FORMULARY     . pravastatin (PRAVACHOL) 40 MG tablet TAKE 1 TABLET BY MOUTH ONCE DAILY 90 tablet 3  . Probiotic Product ([employer]S ADV PROBIOTIC GUMMIES PO) Take by mouth daily.    [name] SYNTHROID 75 MCG tablet TAKE 1 TABLET BY MOUTH ONCE DAILY EXCEPT  ON  WEDNESDAY  TAKE  1.5  TABLETS 102 tablet 3   No current facility-administered medications on file prior to visit.     Past Medical History:  Diagnosis Date  . Anxiety   . GERD (gastroesophageal reflux disease)   . Osteopenia    BMD as per Dr Molli Posey, [place]  . Other abnormal glucose    elevated FBS  . Other and unspecified hyperlipidemia    NMR lipoprofile 2004: LDL 169 (2322/1419), TG 130, LDL goal =<100  . Unspecified hypothyroidism     Past Surgical History:  Procedure Laterality Date  . DILATION AND CURETTAGE OF UTERUS  1987  . EXTERNAL FIXATION ANKLE FRACTURE  1997   post fall (no surgery)  . no colonoscopy     "I don't want to be put to sleep" [facility] reviewed)  . TONSILLECTOMY AND ADENOIDECTOMY  1953    Social History   Socioeconomic History  . Marital status: Married    Spouse name: Not on file  . Number of children: Not on file  . Years of education: Not on file  . Highest education level: Not on file  Occupational History  . Occupation: Theme park manager:  [employer]  . Financial resource strain: Not on file  . Food insecurity    Worry: Not on file    Inability: Not on file  . Transportation needs    Medical: Not on file    Non-medical: Not on file  Tobacco Use  . Smoking status: Former Smoker    Packs/day: 2.50    Years: 20.00    Pack years: 50.00    Types: Cigarettes    Quit date: 01/23/1990    Years since quitting: 28.9  . Smokeless tobacco: Never Used  . Tobacco comment: pt smoked from age [age]  (1973- 1992),up to 2 ppd  Substance and Sexual Activity  . Alcohol use: Yes    Comment: Rarely  . Drug use: No  . Sexual activity: Not on file  Lifestyle  . Physical activity    Days per week: Not on file    Minutes per session: Not on file  . Stress: Not on file  Relationships  . Social Herbalist on phone: Not on file    Gets together: Not on file    Attends religious service: Not on file    Active member of club or  organization: Not on file    Attends meetings of clubs or organizations: Not on file    Relationship status: Not on file  Other Topics Concern  . Not on file  Social History Narrative   Regular exercise- walks dog 2/day - total 45 min   Low fat, carb       Family History  Problem Relation Age of Onset  . Thyroid disease Maternal Aunt        goiter  . Diabetes Other        10 M uncles & M aunts  . Leukemia Father   . Diabetes Mother   . Hypertension Mother   . Coronary artery disease Mother   . Heart attack Mother 39  . Stroke Mother        > 82  . Rheum arthritis Mother   . Kidney failure Mother   . Hypertension Brother   . Other Paternal Uncle   . Alzheimer's disease Paternal Aunt   . Cirrhosis Paternal Uncle   . Heart attack Maternal Uncle 55  . Colon cancer Neg Hx     Review of Systems  Constitutional: Negative for chills and fever.  Eyes: Negative for visual disturbance.  Respiratory: Negative for cough, shortness of breath and wheezing.    Cardiovascular: Negative for chest pain, palpitations and leg swelling.  Gastrointestinal: Negative for abdominal pain, blood in stool, constipation, diarrhea and nausea.       Occ gerd  Genitourinary: Negative for dysuria and hematuria.  Musculoskeletal: Positive for arthralgias (mild). Negative for back pain.  Skin: Negative for color change and rash.  Neurological: Negative for dizziness, light-headedness and headaches.  Psychiatric/Behavioral: Negative for dysphoric mood. The patient is nervous/anxious.        Objective:   Vitals:   12/30/18 0752  BP: (!) 144/84  Pulse: 72  Resp: 16  Temp: 97.7 F (36.5 C)  SpO2: 99%   Filed Weights   12/30/18 0752  Weight: 138 lb (62.6 kg)   Body mass index is 22.62 kg/m.  BP Readings from Last 3 Encounters:  12/30/18 (!) 144/84  01/25/18 134/88  12/27/17 122/76    Wt Readings from Last 3 Encounters:  12/30/18 138 lb (62.6 kg)  01/25/18 135 lb 12.8 oz (61.6 kg)  12/27/17 137 lb (62.1 kg)     Physical Exam Constitutional: She appears well-developed and well-nourished. No distress.  HENT:  Head: Normocephalic and atraumatic.  Right Ear: External ear normal. Normal ear canal and TM Left Ear: External ear normal.  Normal ear canal and TM Mouth/Throat: Oropharynx is clear and moist.  Eyes: Conjunctivae and EOM are normal.  Neck: Neck supple. No tracheal deviation present. No thyromegaly present.  No carotid bruit  Cardiovascular: Normal rate, regular rhythm and normal heart sounds.   1/6 SEM murmur heard.  No edema. Pulmonary/Chest: Effort normal and breath sounds normal. No respiratory distress. She has no wheezes. She has no rales.  Breast: deferred   Abdominal: Soft. She exhibits no distension. There is no tenderness.  Lymphadenopathy: She has no cervical adenopathy.  Skin: Skin is warm and dry. She is not diaphoretic.  Psychiatric: She has a normal mood and affect. Her behavior is normal.        Assessment & Plan:    Physical exam: Screening blood work    ordered Immunizations  Flu vaccine deferred, prevnar deferred Colonoscopy  Up to date  Mammogram  Up to date  Gyn  - no longer sees Dexa  Up  to date  Eye exams  Due - will schedule Exercise    Walks dog twice a day, yard work Weight    Normal BMI Substance abuse    none  See Problem List for Assessment and Plan of chronic medical problems.  This visit occurred during the SARS-CoV-2 public health emergency.  Safety protocols were in place, including screening questions prior to the visit, additional usage of staff PPE, and extensive cleaning of exam room while observing appropriate contact time as indicated for disinfecting solutions.      FU in one year

## 2018-12-30 ENCOUNTER — Ambulatory Visit (INDEPENDENT_AMBULATORY_CARE_PROVIDER_SITE_OTHER): Payer: Medicare HMO | Admitting: Internal Medicine

## 2018-12-30 ENCOUNTER — Other Ambulatory Visit: Payer: Self-pay

## 2018-12-30 ENCOUNTER — Encounter: Payer: Self-pay | Admitting: Internal Medicine

## 2018-12-30 ENCOUNTER — Other Ambulatory Visit (INDEPENDENT_AMBULATORY_CARE_PROVIDER_SITE_OTHER): Payer: Medicare HMO

## 2018-12-30 VITALS — BP 144/84 | HR 72 | Temp 97.7°F | Resp 16 | Ht 65.5 in | Wt 138.0 lb

## 2018-12-30 DIAGNOSIS — E038 Other specified hypothyroidism: Secondary | ICD-10-CM | POA: Diagnosis not present

## 2018-12-30 DIAGNOSIS — M85851 Other specified disorders of bone density and structure, right thigh: Secondary | ICD-10-CM

## 2018-12-30 DIAGNOSIS — R7303 Prediabetes: Secondary | ICD-10-CM

## 2018-12-30 DIAGNOSIS — F419 Anxiety disorder, unspecified: Secondary | ICD-10-CM | POA: Diagnosis not present

## 2018-12-30 DIAGNOSIS — Z0000 Encounter for general adult medical examination without abnormal findings: Secondary | ICD-10-CM | POA: Diagnosis not present

## 2018-12-30 DIAGNOSIS — E7849 Other hyperlipidemia: Secondary | ICD-10-CM | POA: Diagnosis not present

## 2018-12-30 LAB — CBC WITH DIFFERENTIAL/PLATELET
Basophils Absolute: 0 10*3/uL (ref 0.0–0.1)
Basophils Relative: 0.8 % (ref 0.0–3.0)
Eosinophils Absolute: 0.1 10*3/uL (ref 0.0–0.7)
Eosinophils Relative: 2.8 % (ref 0.0–5.0)
HCT: 43.2 % (ref 36.0–46.0)
Hemoglobin: 14.3 g/dL (ref 12.0–15.0)
Lymphocytes Relative: 28.6 % (ref 12.0–46.0)
Lymphs Abs: 1.2 10*3/uL (ref 0.7–4.0)
MCHC: 33.1 g/dL (ref 30.0–36.0)
MCV: 92.3 fl (ref 78.0–100.0)
Monocytes Absolute: 0.3 10*3/uL (ref 0.1–1.0)
Monocytes Relative: 7.6 % (ref 3.0–12.0)
Neutro Abs: 2.5 10*3/uL (ref 1.4–7.7)
Neutrophils Relative %: 60.2 % (ref 43.0–77.0)
Platelets: 239 10*3/uL (ref 150.0–400.0)
RBC: 4.68 Mil/uL (ref 3.87–5.11)
RDW: 13.5 % (ref 11.5–15.5)
WBC: 4.2 10*3/uL (ref 4.0–10.5)

## 2018-12-30 LAB — COMPREHENSIVE METABOLIC PANEL
ALT: 11 U/L (ref 0–35)
AST: 15 U/L (ref 0–37)
Albumin: 4.2 g/dL (ref 3.5–5.2)
Alkaline Phosphatase: 77 U/L (ref 39–117)
BUN: 11 mg/dL (ref 6–23)
CO2: 30 mEq/L (ref 19–32)
Calcium: 9.3 mg/dL (ref 8.4–10.5)
Chloride: 101 mEq/L (ref 96–112)
Creatinine, Ser: 0.77 mg/dL (ref 0.40–1.20)
GFR: 73.31 mL/min (ref >60.00)
Glucose, Bld: 95 mg/dL (ref 70–99)
Potassium: 4.7 mEq/L (ref 3.5–5.1)
Sodium: 137 mEq/L (ref 135–145)
Total Bilirubin: 0.6 mg/dL (ref 0.2–1.2)
Total Protein: 6.6 g/dL (ref 6.0–8.3)

## 2018-12-30 LAB — LIPID PANEL
Cholesterol: 213 mg/dL — ABNORMAL HIGH (ref 0–200)
HDL: 52.5 mg/dL (ref >39.00)
LDL Cholesterol: 141 mg/dL — ABNORMAL HIGH (ref 0–99)
NonHDL: 160
Total CHOL/HDL Ratio: 4
Triglycerides: 94 mg/dL (ref 0.0–149.0)
VLDL: 18.8 mg/dL (ref 0.0–40.0)

## 2018-12-30 LAB — TSH: TSH: 1.73 u[IU]/mL (ref 0.35–4.50)

## 2018-12-30 LAB — HEMOGLOBIN A1C: Hgb A1c MFr Bld: 5.8 % (ref 4.6–6.5)

## 2018-12-30 MED ORDER — ALPRAZOLAM 0.25 MG PO TABS: ORAL_TABLET | ORAL | 0 refills | Status: DC

## 2018-12-30 NOTE — Assessment & Plan Note (Signed)
Check lipid panel, cmp Continue daily statin Regular exercise and healthy diet encouraged  

## 2018-12-30 NOTE — Assessment & Plan Note (Signed)
Clinically euthyroid Check tsh  Titrate med dose if needed  

## 2018-12-30 NOTE — Assessment & Plan Note (Signed)
Intermittent stress - husband has dementia Taking xanax as needed, very helpful Continue  controlled substance database checked.  Ok to fill medication.

## 2018-12-30 NOTE — Assessment & Plan Note (Signed)
Check a1c Low sugar / carb diet Stressed regular exercise   

## 2018-12-31 ENCOUNTER — Encounter: Payer: Self-pay | Admitting: Internal Medicine

## 2019-01-23 ENCOUNTER — Encounter: Payer: Self-pay | Admitting: Internal Medicine

## 2019-02-27 ENCOUNTER — Other Ambulatory Visit: Payer: Self-pay | Admitting: Internal Medicine

## 2019-06-11 ENCOUNTER — Other Ambulatory Visit: Payer: Self-pay | Admitting: Internal Medicine

## 2019-06-12 NOTE — Telephone Encounter (Signed)
Last OV 12/30/18 Next OV 12/31/19 Last RF 12/30/18

## 2019-06-13 ENCOUNTER — Other Ambulatory Visit: Payer: Self-pay | Admitting: Internal Medicine

## 2019-12-30 NOTE — Patient Instructions (Addendum)
Blood work was ordered.     No immunization administered today.   Medications changes include :   none  Your prescription(s) have been submitted to your pharmacy.      Please followup in 1 year    Health Maintenance, Female Adopting a healthy lifestyle and getting preventive care are important in promoting health and wellness. Ask your health care provider about:  The right schedule for you to have regular tests and exams.  Things you can do on your own to prevent diseases and keep yourself healthy. What should I know about diet, weight, and exercise? Eat a healthy diet   Eat a diet that includes plenty of vegetables, fruits, low-fat dairy products, and lean protein.  Do not eat a lot of foods that are high in solid fats, added sugars, or sodium. Maintain a healthy weight Body mass index (BMI) is used to identify weight problems. It estimates body fat based on height and weight. Your health care provider can help determine your BMI and help you achieve or maintain a healthy weight. Get regular exercise Get regular exercise. This is one of the most important things you can do for your health. Most adults should:  Exercise for at least 150 minutes each week. The exercise should increase your heart rate and make you sweat (moderate-intensity exercise).  Do strengthening exercises at least twice a week. This is in addition to the moderate-intensity exercise.  Spend less time sitting. Even light physical activity can be beneficial. Watch cholesterol and blood lipids Have your blood tested for lipids and cholesterol at 74 years of age, then have this test every 5 years. Have your cholesterol levels checked more often if:  Your lipid or cholesterol levels are high.  You are older than 74 years of age.  You are at high risk for heart disease. What should I know about cancer screening? Depending on your health history and family history, you may need to have cancer screening  at various ages. This may include screening for:  Breast cancer.  Cervical cancer.  Colorectal cancer.  Skin cancer.  Lung cancer. What should I know about heart disease, diabetes, and high blood pressure? Blood pressure and heart disease  High blood pressure causes heart disease and increases the risk of stroke. This is more likely to develop in people who have high blood pressure readings, are of African descent, or are overweight.  Have your blood pressure checked: ? Every 3-5 years if you are 63-8 years of age. ? Every year if you are 41 years old or older. Diabetes Have regular diabetes screenings. This checks your fasting blood sugar level. Have the screening done:  Once every three years after age 34 if you are at a normal weight and have a low risk for diabetes.  More often and at a younger age if you are overweight or have a high risk for diabetes. What should I know about preventing infection? Hepatitis B If you have a higher risk for hepatitis B, you should be screened for this virus. Talk with your health care provider to find out if you are at risk for hepatitis B infection. Hepatitis C Testing is recommended for:  Everyone born from [birth year] through 1965.  Anyone with known risk factors for hepatitis C. Sexually transmitted infections (STIs)  Get screened for STIs, including gonorrhea and chlamydia, if: ? You are sexually active and are younger than 74 years of age. ? You are older than 74 years of age and  your health care provider tells you that you are at risk for this type of infection. ? Your sexual activity has changed since you were last screened, and you are at increased risk for chlamydia or gonorrhea. Ask your health care provider if you are at risk.  Ask your health care provider about whether you are at high risk for HIV. Your health care provider may recommend a prescription medicine to help prevent HIV infection. If you choose to take medicine to  prevent HIV, you should first get tested for HIV. You should then be tested every 3 months for as long as you are taking the medicine. Pregnancy  If you are about to stop having your period (premenopausal) and you may become pregnant, seek counseling before you get pregnant.  Take 400 to 800 micrograms (mcg) of folic acid every day if you become pregnant.  Ask for birth control (contraception) if you want to prevent pregnancy. Osteoporosis and menopause Osteoporosis is a disease in which the bones lose minerals and strength with aging. This can result in bone fractures. If you are 9 years old or older, or if you are at risk for osteoporosis and fractures, ask your health care provider if you should:  Be screened for bone loss.  Take a calcium or vitamin D supplement to lower your risk of fractures.  Be given hormone replacement therapy (HRT) to treat symptoms of menopause. Follow these instructions at home: Lifestyle  Do not use any products that contain nicotine or tobacco, such as cigarettes, e-cigarettes, and chewing tobacco. If you need help quitting, ask your health care provider.  Do not use street drugs.  Do not share needles.  Ask your health care provider for help if you need support or information about quitting drugs. Alcohol use  Do not drink alcohol if: ? Your health care provider tells you not to drink. ? You are pregnant, may be pregnant, or are planning to become pregnant.  If you drink alcohol: ? Limit how much you use to 0-1 drink a day. ? Limit intake if you are breastfeeding.  Be aware of how much alcohol is in your drink. In the [place] one drink equals one 12 oz bottle of beer (355 mL), one 5 oz glass of wine (148 mL), or one 1 oz glass of hard liquor (44 mL). General instructions  Schedule regular health, dental, and eye exams.  Stay current with your vaccines.  Tell your health care provider if: ? You often feel depressed. ? You have ever been  abused or do not feel safe at home. Summary  Adopting a healthy lifestyle and getting preventive care are important in promoting health and wellness.  Follow your health care provider's instructions about healthy diet, exercising, and getting tested or screened for diseases.  Follow your health care provider's instructions on monitoring your cholesterol and blood pressure. This information is not intended to replace advice given to you by your health care provider. Make sure you discuss any questions you have with your health care provider. Document Revised: 01/02/2018 Document Reviewed: 01/02/2018 Elsevier Patient Education  2020 [employer].

## 2019-12-30 NOTE — Progress Notes (Signed)
Subjective:    Patient ID: Amy Tucker, female    DOB: December 11, 1945, 74 y.o.   MRN: [id number]   This visit occurred during the SARS-CoV-2 public health emergency.  Safety protocols were in place, including screening questions prior to the visit, additional usage of staff PPE, and extensive cleaning of exam room while observing appropriate contact time as indicated for disinfecting solutions.    HPI She is here for a physical exam.   Left ear issues - she scratched it a couple months ago and popped - something in the ear canal.  She has been using ear plugs a lot.  It feels ok today.    Her husband has dementia and that is stressful.  She is able to leave him home but only for brief periods of time.   Medications and allergies reviewed with patient and updated if appropriate.  Patient Active Problem List   Diagnosis Date Noted  . Anxiety 02/12/2015  . Hyperlipidemia 08/02/2007  . Osteopenia 08/02/2007  . Hypothyroidism 07/31/2006  . Prediabetes 07/31/2006    Current Outpatient Medications on File Prior to Visit  Medication Sig Dispense Refill  . ALPRAZolam (XANAX) 0.25 MG tablet TAKE 1/2 (ONE-HALF) TABLET BY MOUTH EVERY 8 HOURS AS NEEDED 45 tablet 0  . Cholecalciferol (VITAMIN D) 2000 units CAPS Take by mouth.    . ELDERBERRY PO Take by mouth.    . NON FORMULARY     . OVER THE COUNTER MEDICATION CBD gummies    . pravastatin (PRAVACHOL) 40 MG tablet Take 1 tablet by mouth once daily 90 tablet 2  . Probiotic Product ([employer] ADV PROBIOTIC GUMMIES PO) Take by mouth daily.    [name] SYNTHROID 75 MCG tablet TAKE 1 TABLET BY MOUTH ONCE DAILY EXCEPT  ON  WEDNESDAY  TAKE  1.5  TABLETS 102 tablet 2   No current facility-administered medications on file prior to visit.    Past Medical History:  Diagnosis Date  . Anxiety   . GERD (gastroesophageal reflux disease)   . Osteopenia    BMD as per Dr Molli Posey, [place]  . Other abnormal glucose    elevated FBS  . Other and unspecified  hyperlipidemia    NMR lipoprofile 2004: LDL 169 (2322/1419), TG 130, LDL goal =<100  . Unspecified hypothyroidism     Past Surgical History:  Procedure Laterality Date  . DILATION AND CURETTAGE OF UTERUS  1987  . EXTERNAL FIXATION ANKLE FRACTURE  1997   post fall (no surgery)  . no colonoscopy     "I don't want to be put to sleep" [facility] reviewed)  . TONSILLECTOMY AND ADENOIDECTOMY  1953    Social History   Socioeconomic History  . Marital status: Married    Spouse name: Not on file  . Number of children: Not on file  . Years of education: Not on file  . Highest education level: Not on file  Occupational History  . Occupation: Theme park manager: [employer]  Tobacco Use  . Smoking status: Former Smoker    Packs/day: 2.50    Years: 20.00    Pack years: 50.00    Types: Cigarettes    Quit date: 01/23/1990    Years since quitting: 29.9  . Smokeless tobacco: Never Used  . Tobacco comment: pt smoked from age [age]  (1973- 1992),up to 2 ppd  Substance and Sexual Activity  . Alcohol use: Yes    Comment: Rarely  . Drug use: No  .  Sexual activity: Not on file  Other Topics Concern  . Not on file  Social History Narrative   Regular exercise- walks dog 2/day - total 45 min   Low fat, carb      Social Determinants of Health   Financial Resource Strain:   . Difficulty of Paying Living Expenses: Not on file  Food Insecurity:   . Worried About Charity fundraiser in the Last Year: Not on file  . Ran Out of Food in the Last Year: Not on file  Transportation Needs:   . Lack of Transportation (Medical): Not on file  . Lack of Transportation (Non-Medical): Not on file  Physical Activity:   . Days of Exercise per Week: Not on file  . Minutes of Exercise per Session: Not on file  Stress:   . Feeling of Stress : Not on file  Social Connections:   . Frequency of Communication with Friends and Family: Not on file  . Frequency of Social Gatherings with Friends and  Family: Not on file  . Attends Religious Services: Not on file  . Active Member of Clubs or Organizations: Not on file  . Attends Archivist Meetings: Not on file  . Marital Status: Not on file    Family History  Problem Relation Age of Onset  . Thyroid disease Maternal Aunt        goiter  . Diabetes Other        51 M uncles & M aunts  . Leukemia Father   . Diabetes Mother   . Hypertension Mother   . Coronary artery disease Mother   . Heart attack Mother 31  . Stroke Mother        > 28  . Rheum arthritis Mother   . Kidney failure Mother   . Hypertension Brother   . Other Paternal Uncle   . Alzheimer's disease Paternal Aunt   . Cirrhosis Paternal Uncle   . Heart attack Maternal Uncle 55  . Colon cancer Neg Hx     Review of Systems  Constitutional: Negative for chills and fever.  Eyes: Negative for visual disturbance.  Respiratory: Negative for cough, shortness of breath and wheezing.   Cardiovascular: Positive for palpitations (occ). Negative for chest pain and leg swelling.  Gastrointestinal: Negative for abdominal pain, blood in stool, constipation, diarrhea and nausea.       Occ gerd  Genitourinary: Positive for frequency and vaginal discharge. Negative for dysuria and hematuria.  Musculoskeletal: Positive for arthralgias (knees). Negative for back pain.  Skin: Negative for color change and rash.  Neurological: Positive for headaches (sinus related). Negative for light-headedness.  Psychiatric/Behavioral: Negative for dysphoric mood. The patient is nervous/anxious.        Objective:   Vitals:   12/31/19 0812  BP: 136/84  Pulse: 63  Temp: 98.1 F (36.7 C)  SpO2: 99%   Filed Weights   12/31/19 0812  Weight: 140 lb (63.5 kg)   Body mass index is 22.94 kg/m.  BP Readings from Last 3 Encounters:  12/31/19 136/84  12/30/18 (!) 144/84  01/25/18 134/88    Wt Readings from Last 3 Encounters:  12/31/19 140 lb (63.5 kg)  12/30/18 138 lb (62.6 kg)   01/25/18 135 lb 12.8 oz (61.6 kg)     Physical Exam Constitutional: She appears well-developed and well-nourished. No distress.  HENT:  Head: Normocephalic and atraumatic.  Right Ear: External ear normal. Normal ear canal and TM Left Ear: External ear normal.  Normal ear canal and TM Mouth/Throat: Oropharynx is clear and moist.  Eyes: Conjunctivae and EOM are normal.  Neck: Neck supple. No tracheal deviation present. No thyromegaly present.  No carotid bruit  Cardiovascular: Normal rate, regular rhythm and normal heart sounds.   No murmur heard.  No edema. Pulmonary/Chest: Effort normal and breath sounds normal. No respiratory distress. She has no wheezes. She has no rales.  Breast: deferred   Abdominal: Soft. She exhibits no distension. There is no tenderness.  Lymphadenopathy: She has no cervical adenopathy.  Skin: Skin is warm and dry. She is not diaphoretic.  Psychiatric: She has a normal mood and affect. Her behavior is normal.        Assessment & Plan:   Physical exam: Screening blood work    ordered Immunizations  had covid, deferred flu and pneumonia, shingrix Colonoscopy  Due - will schedule Mammogram  Up to date  - due will schedule Gyn  n/a Dexa   Due - deferred  Eye exams  Up to date  Exercise  Walks dog, yard work Weight  normal Substance abuse  none      See Problem List for Assessment and Plan of chronic medical problems.

## 2019-12-31 ENCOUNTER — Encounter: Payer: Self-pay | Admitting: Internal Medicine

## 2019-12-31 ENCOUNTER — Ambulatory Visit: Payer: Medicare HMO

## 2019-12-31 ENCOUNTER — Ambulatory Visit (INDEPENDENT_AMBULATORY_CARE_PROVIDER_SITE_OTHER): Payer: Medicare HMO | Admitting: Internal Medicine

## 2019-12-31 ENCOUNTER — Other Ambulatory Visit: Payer: Self-pay

## 2019-12-31 VITALS — BP 136/84 | HR 63 | Temp 98.1°F | Ht 65.5 in | Wt 140.0 lb

## 2019-12-31 DIAGNOSIS — R7303 Prediabetes: Secondary | ICD-10-CM

## 2019-12-31 DIAGNOSIS — F419 Anxiety disorder, unspecified: Secondary | ICD-10-CM | POA: Diagnosis not present

## 2019-12-31 DIAGNOSIS — Z0000 Encounter for general adult medical examination without abnormal findings: Secondary | ICD-10-CM | POA: Diagnosis not present

## 2019-12-31 DIAGNOSIS — E038 Other specified hypothyroidism: Secondary | ICD-10-CM | POA: Diagnosis not present

## 2019-12-31 DIAGNOSIS — M85851 Other specified disorders of bone density and structure, right thigh: Secondary | ICD-10-CM

## 2019-12-31 DIAGNOSIS — E7849 Other hyperlipidemia: Secondary | ICD-10-CM

## 2019-12-31 LAB — COMPREHENSIVE METABOLIC PANEL
ALT: 13 U/L (ref 0–35)
AST: 21 U/L (ref 0–37)
Albumin: 4.3 g/dL (ref 3.5–5.2)
Alkaline Phosphatase: 79 U/L (ref 39–117)
BUN: 10 mg/dL (ref 6–23)
CO2: 31 mEq/L (ref 19–32)
Calcium: 9.4 mg/dL (ref 8.4–10.5)
Chloride: 101 mEq/L (ref 96–112)
Creatinine, Ser: 0.79 mg/dL (ref 0.40–1.20)
GFR: 73.47 mL/min (ref >60.00)
Glucose, Bld: 94 mg/dL (ref 70–99)
Potassium: 4.4 mEq/L (ref 3.5–5.1)
Sodium: 138 mEq/L (ref 135–145)
Total Bilirubin: 0.6 mg/dL (ref 0.2–1.2)
Total Protein: 6.9 g/dL (ref 6.0–8.3)

## 2019-12-31 LAB — LIPID PANEL
Cholesterol: 246 mg/dL — ABNORMAL HIGH (ref 0–200)
HDL: 51.2 mg/dL (ref >39.00)
LDL Cholesterol: 158 mg/dL — ABNORMAL HIGH (ref 0–99)
NonHDL: 195.29
Total CHOL/HDL Ratio: 5
Triglycerides: 185 mg/dL — ABNORMAL HIGH (ref 0.0–149.0)
VLDL: 37 mg/dL (ref 0.0–40.0)

## 2019-12-31 LAB — CBC WITH DIFFERENTIAL/PLATELET
Basophils Absolute: 0 10*3/uL (ref 0.0–0.1)
Basophils Relative: 0.5 % (ref 0.0–3.0)
Eosinophils Absolute: 0.1 10*3/uL (ref 0.0–0.7)
Eosinophils Relative: 2.3 % (ref 0.0–5.0)
HCT: 44.7 % (ref 36.0–46.0)
Hemoglobin: 14.7 g/dL (ref 12.0–15.0)
Lymphocytes Relative: 26.4 % (ref 12.0–46.0)
Lymphs Abs: 1.1 10*3/uL (ref 0.7–4.0)
MCHC: 32.8 g/dL (ref 30.0–36.0)
MCV: 92.4 fl (ref 78.0–100.0)
Monocytes Absolute: 0.3 10*3/uL (ref 0.1–1.0)
Monocytes Relative: 6.3 % (ref 3.0–12.0)
Neutro Abs: 2.8 10*3/uL (ref 1.4–7.7)
Neutrophils Relative %: 64.5 % (ref 43.0–77.0)
Platelets: 256 10*3/uL (ref 150.0–400.0)
RBC: 4.84 Mil/uL (ref 3.87–5.11)
RDW: 13.6 % (ref 11.5–15.5)
WBC: 4.3 10*3/uL (ref 4.0–10.5)

## 2019-12-31 LAB — HEMOGLOBIN A1C: Hgb A1c MFr Bld: 5.7 % (ref 4.6–6.5)

## 2019-12-31 LAB — TSH: TSH: 2.49 u[IU]/mL (ref 0.35–4.50)

## 2019-12-31 MED ORDER — SYNTHROID 75 MCG PO TABS: ORAL_TABLET | ORAL | 3 refills | Status: DC

## 2019-12-31 MED ORDER — ALPRAZOLAM 0.25 MG PO TABS: ORAL_TABLET | ORAL | 1 refills | Status: DC

## 2019-12-31 MED ORDER — PRAVASTATIN SODIUM 40 MG PO TABS: 40.0000 mg | ORAL_TABLET | Freq: Every day | ORAL | 3 refills | Status: DC

## 2019-12-31 NOTE — Assessment & Plan Note (Signed)
Chronic Check lipid panel  Continue pravastatin 40 mg daily Regular exercise and healthy diet encouraged  

## 2019-12-31 NOTE — Assessment & Plan Note (Signed)
Chronic Check a1c Low sugar / carb diet Stressed regular exercise  

## 2019-12-31 NOTE — Assessment & Plan Note (Signed)
Chronic  Clinically euthyroid Currently taking levothyroxine 75 mcg daily, 1.5 pills once a week Check tsh  Titrate med dose if needed

## 2019-12-31 NOTE — Assessment & Plan Note (Signed)
Chronic She deferred dexa - will consider next year She walks daily She is taking vitamin d daily

## 2019-12-31 NOTE — Assessment & Plan Note (Signed)
Chronic Intermittent Continue xanax  1/2 0.25 mg daily prn

## 2020-04-12 ENCOUNTER — Ambulatory Visit (INDEPENDENT_AMBULATORY_CARE_PROVIDER_SITE_OTHER): Payer: Medicare HMO

## 2020-04-12 DIAGNOSIS — Z0000 Encounter for general adult medical examination without abnormal findings: Secondary | ICD-10-CM

## 2020-04-12 NOTE — Patient Instructions (Signed)
Amy Tucker , Thank you for taking time to come for your Medicare Wellness Visit. I appreciate your ongoing commitment to your health goals. Please review the following plan we discussed and let me know if I can assist you in the future.   Screening recommendations/referrals: Colonoscopy: 02/15/2016; due every 3 years (scheduled for 05/25/2020) Mammogram: 01/30/2018; due every 1-2 years (due 2022) Bone Density: 12/27/2017; due every 2 years (due 2022) Recommended yearly ophthalmology/optometry visit for glaucoma screening and checkup Recommended yearly dental visit for hygiene and checkup  Vaccinations: Influenza vaccine: declined Pneumococcal vaccine: declined Tdap vaccine: 06/21/2013; due every 10 years (2025) Shingles vaccine: declined   Covid-19: 09/12/2019, 10/08/2019  Advanced directives: Please bring a copy of your health care power of attorney and living will to the office at your convenience.  Conditions/risks identified: Yes; Reviewed health maintenance screenings with patient today and relevant education, vaccines, and/or referrals were provided. Please continue to do your personal lifestyle choices by: daily care of teeth and gums, regular physical activity (goal should be 5 days a week for 30 minutes), eat a healthy diet, avoid tobacco and drug use, limiting any alcohol intake, taking a low-dose aspirin (if not allergic or have been advised by your provider otherwise) and taking vitamins and minerals as recommended by your provider. Continue doing brain stimulating activities (puzzles, reading, adult coloring books, staying active) to keep memory sharp. Continue to eat heart healthy diet (full of fruits, vegetables, whole grains, lean protein, water--limit salt, fat, and sugar intake) and increase physical activity as tolerated.  Next appointment: Please schedule your next Medicare Wellness Visit with your Nurse Health Advisor in 1 year by calling [phone].  Preventive Care 3 Years and  Older, Female Preventive care refers to lifestyle choices and visits with your health care provider that can promote health and wellness. What does preventive care include?  A yearly physical exam. This is also called an annual well check.  Dental exams once or twice a year.  Routine eye exams. Ask your health care provider how often you should have your eyes checked.  Personal lifestyle choices, including:  Daily care of your teeth and gums.  Regular physical activity.  Eating a healthy diet.  Avoiding tobacco and drug use.  Limiting alcohol use.  Practicing safe sex.  Taking low-dose aspirin every day.  Taking vitamin and mineral supplements as recommended by your health care provider. What happens during an annual well check? The services and screenings done by your health care provider during your annual well check will depend on your age, overall health, lifestyle risk factors, and family history of disease. Counseling  Your health care provider may ask you questions about your:  Alcohol use.  Tobacco use.  Drug use.  Emotional well-being.  Home and relationship well-being.  Sexual activity.  Eating habits.  History of falls.  Memory and ability to understand (cognition).  Work and work Statistician.  Reproductive health. Screening  You may have the following tests or measurements:  Height, weight, and BMI.  Blood pressure.  Lipid and cholesterol levels. These may be checked every 5 years, or more frequently if you are over 53 years old.  Skin check.  Lung cancer screening. You may have this screening every year starting at age 53 if you have a 30-pack-year history of smoking and currently smoke or have quit within the past 15 years.  Fecal occult blood test (FOBT) of the stool. You may have this test every year starting at age 36.  Flexible sigmoidoscopy or colonoscopy. You may have a sigmoidoscopy every 5 years or a colonoscopy every 10 years  starting at age 3.  Hepatitis C blood test.  Hepatitis B blood test.  Sexually transmitted disease (STD) testing.  Diabetes screening. This is done by checking your blood sugar (glucose) after you have not eaten for a while (fasting). You may have this done every 1-3 years.  Bone density scan. This is done to screen for osteoporosis. You may have this done starting at age 4.  Mammogram. This may be done every 1-2 years. Talk to your health care provider about how often you should have regular mammograms. Talk with your health care provider about your test results, treatment options, and if necessary, the need for more tests. Vaccines  Your health care provider may recommend certain vaccines, such as:  Influenza vaccine. This is recommended every year.  Tetanus, diphtheria, and acellular pertussis (Tdap, Td) vaccine. You may need a Td booster every 10 years.  Zoster vaccine. You may need this after age 87.  Pneumococcal 13-valent conjugate (PCV13) vaccine. One dose is recommended after age 21.  Pneumococcal polysaccharide (PPSV23) vaccine. One dose is recommended after age 71. Talk to your health care provider about which screenings and vaccines you need and how often you need them. This information is not intended to replace advice given to you by your health care provider. Make sure you discuss any questions you have with your health care provider. Document Released: 02/05/2015 Document Revised: 09/29/2015 Document Reviewed: 11/10/2014 Elsevier Interactive Patient Education  2017 [place] Prevention in the Home Falls can cause injuries. They can happen to people of all ages. There are many things you can do to make your home safe and to help prevent falls. What can I do on the outside of my home?  Regularly fix the edges of walkways and driveways and fix any cracks.  Remove anything that might make you trip as you walk through a door, such as a raised step or  threshold.  Trim any bushes or trees on the path to your home.  Use bright outdoor lighting.  Clear any walking paths of anything that might make someone trip, such as rocks or tools.  Regularly check to see if handrails are loose or broken. Make sure that both sides of any steps have handrails.  Any raised decks and porches should have guardrails on the edges.  Have any leaves, snow, or ice cleared regularly.  Use sand or salt on walking paths during winter.  Clean up any spills in your garage right away. This includes oil or grease spills. What can I do in the bathroom?  Use night lights.  Install grab bars by the toilet and in the tub and shower. Do not use towel bars as grab bars.  Use non-skid mats or decals in the tub or shower.  If you need to sit down in the shower, use a plastic, non-slip stool.  Keep the floor dry. Clean up any water that spills on the floor as soon as it happens.  Remove soap buildup in the tub or shower regularly.  Attach bath mats securely with double-sided non-slip rug tape.  Do not have throw rugs and other things on the floor that can make you trip. What can I do in the bedroom?  Use night lights.  Make sure that you have a light by your bed that is easy to reach.  Do not use any sheets or blankets  that are too big for your bed. They should not hang down onto the floor.  Have a firm chair that has side arms. You can use this for support while you get dressed.  Do not have throw rugs and other things on the floor that can make you trip. What can I do in the kitchen?  Clean up any spills right away.  Avoid walking on wet floors.  Keep items that you use a lot in easy-to-reach places.  If you need to reach something above you, use a strong step stool that has a grab bar.  Keep electrical cords out of the way.  Do not use floor polish or wax that makes floors slippery. If you must use wax, use non-skid floor wax.  Do not have  throw rugs and other things on the floor that can make you trip. What can I do with my stairs?  Do not leave any items on the stairs.  Make sure that there are handrails on both sides of the stairs and use them. Fix handrails that are broken or loose. Make sure that handrails are as long as the stairways.  Check any carpeting to make sure that it is firmly attached to the stairs. Fix any carpet that is loose or worn.  Avoid having throw rugs at the top or bottom of the stairs. If you do have throw rugs, attach them to the floor with carpet tape.  Make sure that you have a light switch at the top of the stairs and the bottom of the stairs. If you do not have them, ask someone to add them for you. What else can I do to help prevent falls?  Wear shoes that:  Do not have high heels.  Have rubber bottoms.  Are comfortable and fit you well.  Are closed at the toe. Do not wear sandals.  If you use a stepladder:  Make sure that it is fully opened. Do not climb a closed stepladder.  Make sure that both sides of the stepladder are locked into place.  Ask someone to hold it for you, if possible.  Clearly mark and make sure that you can see:  Any grab bars or handrails.  First and last steps.  Where the edge of each step is.  Use tools that help you move around (mobility aids) if they are needed. These include:  Canes.  Walkers.  Scooters.  Crutches.  Turn on the lights when you go into a dark area. Replace any light bulbs as soon as they burn out.  Set up your furniture so you have a clear path. Avoid moving your furniture around.  If any of your floors are uneven, fix them.  If there are any pets around you, be aware of where they are.  Review your medicines with your doctor. Some medicines can make you feel dizzy. This can increase your chance of falling. Ask your doctor what other things that you can do to help prevent falls. This information is not intended to  replace advice given to you by your health care provider. Make sure you discuss any questions you have with your health care provider. Document Released: 11/05/2008 Document Revised: 06/17/2015 Document Reviewed: 02/13/2014 Elsevier Interactive Patient Education  2017 [employer].

## 2020-04-12 NOTE — Progress Notes (Signed)
I connected with Amy Tucker today by telephone and verified that I am speaking with the correct person using two identifiers. Location patient: home Location provider: work Persons participating in the virtual visit: Julissa Browning and Lisette Abu, LPN.   I discussed the limitations, risks, security and privacy concerns of performing an evaluation and management service by telephone and the availability of in person appointments. I also discussed with the patient that there may be a patient responsible charge related to this service. The patient expressed understanding and verbally consented to this telephonic visit.    Interactive audio and video telecommunications were attempted between this provider and patient, however failed, due to patient having technical difficulties OR patient did not have access to video capability.  We continued and completed visit with audio only.  Some vital signs may be absent or patient reported.   Time Spent with patient on telephone encounter: 25 minutes  Subjective:   Amy Tucker is a 75 y.o. female who presents for Medicare Annual (Subsequent) preventive examination.  Review of Systems    No ROS. Medicare Wellness Visit. Additional risk factors are reflected in social history. Cardiac Risk Factors include: advanced age (>68men, >68 women);dyslipidemia;family history of premature cardiovascular disease     Objective:    There were no vitals filed for this visit. There is no height or weight on file to calculate BMI.  Advanced Directives 04/12/2020 02/15/2016 02/04/2016  Does Patient Have a Medical Advance Directive? Yes No No  Type of Advance Directive Living will;Healthcare Power of Attorney - -  Copy of [place] in Chart? No - copy requested - -    Current Medications (verified) Outpatient Encounter Medications as of 04/12/2020  Medication Sig  . ALPRAZolam (XANAX) 0.25 MG tablet TAKE 1/2 (ONE-HALF) TABLET BY MOUTH  EVERY 8 HOURS AS NEEDED  . Cholecalciferol (VITAMIN D) 2000 units CAPS Take by mouth.  . ELDERBERRY PO Take by mouth.  . NON FORMULARY   . OVER THE COUNTER MEDICATION CBD gummies  . pravastatin (PRAVACHOL) 40 MG tablet Take 1 tablet (40 mg total) by mouth daily.  . Probiotic Product ([employer] ADV PROBIOTIC GUMMIES PO) Take by mouth daily.  [name] SYNTHROID 75 MCG tablet TAKE 1 TABLET BY MOUTH ONCE DAILY EXCEPT  ON  WEDNESDAY  TAKE  1.5  TABLETS   No facility-administered encounter medications on file as of 04/12/2020.    Allergies (verified) Ciprofloxacin, Meloxicam, Tramadol hcl, and Zocor [simvastatin]   History: Past Medical History:  Diagnosis Date  . Anxiety   . GERD (gastroesophageal reflux disease)   . Osteopenia    BMD as per Dr Molli Posey, [place]  . Other abnormal glucose    elevated FBS  . Other and unspecified hyperlipidemia    NMR lipoprofile 2004: LDL 169 (2322/1419), TG 130, LDL goal =<100  . Unspecified hypothyroidism    Past Surgical History:  Procedure Laterality Date  . DILATION AND CURETTAGE OF UTERUS  1987  . EXTERNAL FIXATION ANKLE FRACTURE  1997   post fall (no surgery)  . no colonoscopy     "I don't want to be put to sleep" [facility] reviewed)  . TONSILLECTOMY AND ADENOIDECTOMY  1953   Family History  Problem Relation Age of Onset  . Thyroid disease Maternal Aunt        goiter  . Diabetes Other        19 M uncles & M aunts  . Leukemia Father   . Diabetes Mother   .  Hypertension Mother   . Coronary artery disease Mother   . Heart attack Mother 13  . Stroke Mother        > 21  . Rheum arthritis Mother   . Kidney failure Mother   . Hypertension Brother   . Other Paternal Uncle   . Alzheimer's disease Paternal Aunt   . Cirrhosis Paternal Uncle   . Heart attack Maternal Uncle 55  . Colon cancer Neg Hx    Social History   Socioeconomic History  . Marital status: Married    Spouse name: Not on file  . Number of children: Not on file  . Years of  education: Not on file  . Highest education level: Not on file  Occupational History  . Occupation: Theme park manager: [employer]  Tobacco Use  . Smoking status: Former Smoker    Packs/day: 2.50    Years: 20.00    Pack years: 50.00    Types: Cigarettes    Quit date: 01/23/1990    Years since quitting: 30.2  . Smokeless tobacco: Never Used  . Tobacco comment: pt smoked from age [age]  (1973- 1992),up to 2 ppd  Substance and Sexual Activity  . Alcohol use: Yes    Comment: Rarely  . Drug use: No  . Sexual activity: Not on file  Other Topics Concern  . Not on file  Social History Narrative   Regular exercise- walks dog 2/day - total 45 min   Low fat, carb      Social Determinants of Health   Financial Resource Strain: Low Risk   . Difficulty of Paying Living Expenses: Not hard at all  Food Insecurity: No Food Insecurity  . Worried About Charity fundraiser in the Last Year: Never true  . Ran Out of Food in the Last Year: Never true  Transportation Needs: No Transportation Needs  . Lack of Transportation (Medical): No  . Lack of Transportation (Non-Medical): No  Physical Activity: Sufficiently Active  . Days of Exercise per Week: 7 days  . Minutes of Exercise per Session: 30 min  Stress: Stress Concern Present  . Feeling of Stress : To some extent  Social Connections: Not on file    Tobacco Counseling Counseling given: Not Answered Comment: pt smoked from age [age]  (62- 1992),up to 2 ppd   Clinical Intake:  Pre-visit preparation completed: Yes  Pain : No/denies pain     Nutritional Risks: None Diabetes: No  How often do you need to have someone help you when you read instructions, pamphlets, or other written materials from your doctor or pharmacy?: 1 - Never What is the last grade level you completed in school?: High School Graduate  Diabetic? no  Interpreter Needed?: No  Information entered by :: Lisette Abu, LPN   Activities of  Daily Living In your present state of health, do you have any difficulty performing the following activities: 04/12/2020  Hearing? Y  Vision? N  Difficulty concentrating or making decisions? N  Walking or climbing stairs? N  Dressing or bathing? N  Doing errands, shopping? N  Preparing Food and eating ? N  Using the Toilet? N  In the past six months, have you accidently leaked urine? N  Do you have problems with loss of bowel control? N  Managing your Medications? N  Managing your Finances? N  Housekeeping or managing your Housekeeping? N  Some recent data might be hidden    Patient Care Team: Quay Burow,  Claudina Lick, MD as PCP - General (Internal Medicine)  Indicate any recent Medical Services you may have received from other than [facility] providers in the past year (date may be approximate).     Assessment:   This is a routine wellness examination for [place].  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 Patient Stated     To maintain my current health status by continuing to eat healthy and stay physically active.      Depression Screen PHQ 2/9 Scores 04/12/2020 12/30/2018 12/25/2016 05/22/2015 10/24/2012  PHQ - 2 Score 1 0 0 0 0    Fall Risk Fall Risk  04/12/2020 12/31/2019 12/30/2018 12/25/2016 05/22/2015  Falls in the past year? 0 0 1 No No  Number falls in past yr: 0 0 0 - -  Injury with Fall? 0 0 0 - -  Risk for fall due to : No Fall Risks No Fall Risks - - -  Follow up Falls evaluation completed Falls evaluation completed - - -    FALL RISK PREVENTION PERTAINING TO THE HOME:  Any stairs in or around the home? Yes  If so, are  there any without handrails? No  Home free of loose throw rugs in walkways, pet beds, electrical cords, etc? Yes  Adequate lighting in your home to reduce risk of falls? Yes   ASSISTIVE DEVICES UTILIZED TO PREVENT FALLS:  Life alert? No  Use of a cane, walker or w/c? No  Grab bars in the bathroom? No  Shower chair or bench in shower? Yes  Elevated toilet seat or a handicapped toilet? No   TIMED UP AND GO:  Was the test performed? No .  Length of time to ambulate 10 feet: 0 sec.   Gait steady and fast without use of assistive device  Cognitive Function: No flowsheet data found.         Immunizations Immunization History  Administered Date(s) Administered  . PFIZER(Purple Top)SARS-COV-2 Vaccination 09/12/2019, 10/08/2019  . Td 01/23/2005, 06/21/2013    TDAP status: Up to date  Flu Vaccine status: Declined, Education has been provided regarding the importance of this vaccine but patient still declined. Advised may receive this vaccine at local pharmacy or Health Dept. Aware to provide a copy of the vaccination record if obtained from local pharmacy or Health Dept. Verbalized acceptance and understanding.  Pneumococcal vaccine status: Declined,  Education has been provided regarding the importance of this vaccine but patient still declined. Advised may receive this vaccine at local pharmacy or Health Dept. Aware to provide a copy of the vaccination record if obtained from local pharmacy or Health Dept. Verbalized acceptance and understanding.   Covid-19 vaccine status: Completed vaccines  Qualifies for Shingles Vaccine? Yes   Zostavax completed No   Shingrix Completed?: No.    Education has been provided regarding the importance of this vaccine. Patient has been advised to call insurance company to determine out of pocket expense if they have not yet received this vaccine. Advised may also receive vaccine at local pharmacy or Health Dept. Verbalized acceptance and  understanding.  Screening Tests Health Maintenance  Topic Date Due  . COLONOSCOPY (Pts 45-40yrs Insurance coverage will need to be confirmed)  02/15/2019  .  COVID-19 Vaccine (3 - Booster for Pfizer series) 04/06/2020  . INFLUENZA VACCINE  04/22/2020 (Originally 08/24/2019)  . DEXA SCAN  12/30/2020 (Originally 12/28/2019)  . PNA vac Low Risk Adult (1 of 2 - PCV13) 12/30/2020 (Originally 03/01/2010)  . TETANUS/TDAP  06/22/2023  . Hepatitis C Screening  Completed  . HPV VACCINES  Aged Out    Health Maintenance  Health Maintenance Due  Topic Date Due  . COLONOSCOPY (Pts 45-29yrs Insurance coverage will need to be confirmed)  02/15/2019  . COVID-19 Vaccine (3 - Booster for Pfizer series) 04/06/2020    Colorectal cancer screening: Type of screening: Colonoscopy. Completed 02/15/2016. Repeat every 3 years  Mammogram status: Completed 01/30/2018. Repeat every year  Bone Density status: Completed 12/27/2017. Results reflect: Bone density results: OSTEOPENIA. Repeat every 2 years.  Lung Cancer Screening: (Low Dose CT Chest recommended if Age 33-80 years, 30 pack-year currently smoking OR have quit w/in 15years.) does qualify.   Lung Cancer Screening Referral: no  Additional Screening:  Hepatitis C Screening: does qualify; Completed yes  Vision Screening: Recommended annual ophthalmology exams for early detection of glaucoma and other disorders of the eye. Is the patient up to date with their annual eye exam?  Yes  Who is the provider or what is the name of the office in which the patient attends annual eye exams? [facility] If pt is not established with a provider, would they like to be referred to a provider to establish care? No .   Dental Screening: Recommended annual dental exams for proper oral hygiene  Community Resource Referral / Chronic Care Management: CRR required this visit?  No   CCM required this visit?  No      Plan:     I have personally reviewed and noted  the following in the patient's chart:   . Medical and social history . Use of alcohol, tobacco or illicit drugs  . Current medications and supplements . Functional ability and status . Nutritional status . Physical activity . Advanced directives . List of other physicians . Hospitalizations, surgeries, and ER visits in previous 12 months . Vitals . Screenings to include cognitive, depression, and falls . Referrals and appointments  In addition, I have reviewed and discussed with patient certain preventive protocols, quality metrics, and best practice recommendations. A written personalized care plan for preventive services as well as general preventive health recommendations were provided to patient.     Sheral Flow, LPN   09/20/9369   Nurse Notes:   Patient is cogitatively intact. There were no vitals filed for this visit. There is no height or weight on file to calculate BMI. Patient stated that she has no issues with gait or balance; does not use any assistive devices. Medications reviewed with patient; no opioid use noted.

## 2020-05-03 ENCOUNTER — Ambulatory Visit (AMBULATORY_SURGERY_CENTER): Payer: Self-pay | Admitting: *Deleted

## 2020-05-03 ENCOUNTER — Other Ambulatory Visit: Payer: Self-pay

## 2020-05-03 VITALS — Ht 65.5 in | Wt 137.0 lb

## 2020-05-03 DIAGNOSIS — Z86010 Personal history of colonic polyps: Secondary | ICD-10-CM

## 2020-05-03 MED ORDER — NA SULFATE-K SULFATE-MG SULF 17.5-3.13-1.6 GM/177ML PO SOLN: ORAL | 0 refills | Status: DC

## 2020-05-03 NOTE — Progress Notes (Signed)

## 2020-05-25 ENCOUNTER — Encounter: Payer: Self-pay | Admitting: Gastroenterology

## 2020-05-25 ENCOUNTER — Other Ambulatory Visit: Payer: Self-pay

## 2020-05-25 ENCOUNTER — Ambulatory Visit (AMBULATORY_SURGERY_CENTER): Payer: Medicare HMO | Admitting: Gastroenterology

## 2020-05-25 VITALS — BP 132/58 | HR 66 | Temp 97.8°F | Resp 22 | Ht 65.5 in | Wt 137.0 lb

## 2020-05-25 DIAGNOSIS — D120 Benign neoplasm of cecum: Secondary | ICD-10-CM

## 2020-05-25 DIAGNOSIS — Z86010 Personal history of colonic polyps: Secondary | ICD-10-CM

## 2020-05-25 DIAGNOSIS — Z1211 Encounter for screening for malignant neoplasm of colon: Secondary | ICD-10-CM | POA: Diagnosis not present

## 2020-05-25 DIAGNOSIS — D123 Benign neoplasm of transverse colon: Secondary | ICD-10-CM

## 2020-05-25 MED ORDER — SODIUM CHLORIDE 0.9 % IV SOLN: 500.0000 mL | Freq: Once | INTRAVENOUS | Status: DC

## 2020-05-25 NOTE — Progress Notes (Signed)
VS my MO   Pt's states no medical or surgical changes since previsit or office visit.

## 2020-05-25 NOTE — Progress Notes (Signed)
Called to room to assist during endoscopic procedure.  Patient ID and intended procedure confirmed with present staff. Received instructions for my participation in the procedure from the performing physician.  

## 2020-05-25 NOTE — Progress Notes (Signed)
To pacu, VSS. Report to [email or web address] 

## 2020-05-25 NOTE — Op Note (Signed)
[facility] Patient Name: Amy Tucker Procedure Date: 05/25/2020 2:00 PM MRN: [id number] Endoscopist: Mauri Pole , MD Age: 75 Referring MD:  Date of Birth: 12-27-1945 Gender: Female Account #: [id number] Procedure:                Colonoscopy Indications:              High risk colon cancer surveillance: Personal                            history of colonic polyps, High risk colon cancer                            surveillance: Personal history of adenoma (10 mm or                            greater in size), High risk colon cancer                            surveillance: Personal history of multiple (3 or                            more) adenomas Medicines:                Monitored Anesthesia Care Procedure:                Pre-Anesthesia Assessment:                           - Prior to the procedure, a History and Physical                            was performed, and patient medications and                            allergies were reviewed. The patient's tolerance of                            previous anesthesia was also reviewed. The risks                            and benefits of the procedure and the sedation                            options and risks were discussed with the patient.                            All questions were answered, and informed consent                            was obtained. Prior Anticoagulants: The patient has                            taken no previous anticoagulant or antiplatelet  agents. ASA Grade Assessment: II - A patient with                            mild systemic disease. After reviewing the risks                            and benefits, the patient was deemed in                            satisfactory condition to undergo the procedure.                           After obtaining informed consent, the colonoscope                            was passed under direct vision. Throughout the                             procedure, the patient's blood pressure, pulse, and                            oxygen saturations were monitored continuously. The                            Olympus [id number]-[id number] (#[id number]) Colonoscope was                            introduced through the anus and advanced to the the                            cecum, identified by appendiceal orifice and                            ileocecal valve. The colonoscopy was performed                            without difficulty. The patient tolerated the                            procedure well. The quality of the bowel                            preparation was good. The ileocecal valve,                            appendiceal orifice, and rectum were photographed. Scope In: 2:19:22 PM Scope Out: 2:41:37 PM Scope Withdrawal Time: 0 hours 11 minutes 27 seconds  Total Procedure Duration: 0 hours 22 minutes 15 seconds  Findings:                 The perianal and digital rectal examinations were                            normal.  Two sessile polyps were found in the cecum and                            ileocecal valve. The polyps were 3 to 4 mm in size.                            These polyps were removed with a cold snare.                            Resection and retrieval were complete.                           A less than 1 mm polyp was found in the transverse                            colon. The polyp was sessile. The polyp was removed                            with a cold biopsy forceps. Resection and retrieval                            were complete.                           Non-bleeding internal hemorrhoids were found during                            retroflexion. The hemorrhoids were medium-sized. Complications:            No immediate complications. Estimated Blood Loss:     Estimated blood loss was minimal. Impression:               - Two 3 to 4 mm polyps in the cecum and at the                             ileocecal valve, removed with a cold snare.                            Resected and retrieved.                           - One less than 1 mm polyp in the transverse colon,                            removed with a cold biopsy forceps. Resected and                            retrieved.                           - Non-bleeding internal hemorrhoids. Recommendation:           - Patient has a contact number available for  emergencies. The signs and symptoms of potential                            delayed complications were discussed with the                            patient. Return to normal activities tomorrow.                            Written discharge instructions were provided to the                            patient.                           - Resume previous diet.                           - Continue present medications.                           - Await pathology results.                           - No repeat colonoscopy due to age. Mauri Pole, MD 05/25/2020 2:49:02 PM This report has been signed electronically.

## 2020-05-25 NOTE — Patient Instructions (Signed)
Await pathology  Please read over handouts about polyps and hemorrhoids  Continue your normal medications   YOU HAD AN ENDOSCOPIC PROCEDURE TODAY AT THE [facility]:   Refer to the procedure report that was given to you for any specific questions about what was found during the examination.  If the procedure report does not answer your questions, please call your gastroenterologist to clarify.  If you requested that your care partner not be given the details of your procedure findings, then the procedure report has been included in a sealed envelope for you to review at your convenience later.  YOU SHOULD EXPECT: Some feelings of bloating in the abdomen. Passage of more gas than usual.  Walking can help get rid of the air that was put into your GI tract during the procedure and reduce the bloating. If you had a lower endoscopy (such as a colonoscopy or flexible sigmoidoscopy) you may notice spotting of blood in your stool or on the toilet paper. If you underwent a bowel prep for your procedure, you may not have a normal bowel movement for a few days.  Please Note:  You might notice some irritation and congestion in your nose or some drainage.  This is from the oxygen used during your procedure.  There is no need for concern and it should clear up in a day or so.  SYMPTOMS TO REPORT IMMEDIATELY:  Following lower endoscopy (colonoscopy or flexible sigmoidoscopy):  Excessive amounts of blood in the stool  Significant tenderness or worsening of abdominal pains  Swelling of the abdomen that is new, acute  Fever of 100F or higher  For urgent or emergent issues, a gastroenterologist can be reached at any hour by calling [phone]. Do not use MyChart messaging for urgent concerns.    DIET:  We do recommend a small meal at first, but then you may proceed to your regular diet.  Drink plenty of fluids but you should avoid alcoholic beverages for 24 hours.  ACTIVITY:  You should  plan to take it easy for the rest of today and you should NOT DRIVE or use heavy machinery until tomorrow (because of the sedation medicines used during the test).    FOLLOW UP: Our staff will call the number listed on your records 48-72 hours following your procedure to check on you and address any questions or concerns that you may have regarding the information given to you following your procedure. If we do not reach you, we will leave a message.  We will attempt to reach you two times.  During this call, we will ask if you have developed any symptoms of COVID 19. If you develop any symptoms (ie: fever, flu-like symptoms, shortness of breath, cough etc.) before then, please call [phone].  If you test positive for Covid 19 in the 2 weeks post procedure, please call and report this information to us.    If any biopsies were taken you will be contacted by phone or by letter within the next 1-3 weeks.  Please call us at [phone] if you have not heard about the biopsies in 3 weeks.    SIGNATURES/CONFIDENTIALITY: You and/or your care partner have signed paperwork which will be entered into your electronic medical record.  These signatures attest to the fact that that the information above on your After Visit Summary has been reviewed and is understood.  Full responsibility of the confidentiality of this discharge information lies with you and/or your care-partner.  

## 2020-05-25 NOTE — Progress Notes (Signed)
Sinus bradycardia down to 32, robinul 0.2 given. Will conitnue to monitor.tb

## 2020-05-26 ENCOUNTER — Encounter: Payer: Self-pay | Admitting: Gastroenterology

## 2020-05-27 ENCOUNTER — Telehealth: Payer: Self-pay | Admitting: *Deleted

## 2020-05-27 NOTE — Telephone Encounter (Signed)
  Follow up Call-  Call back number 05/25/2020  Post procedure Call Back phone  # [phone]  Permission to leave phone message Yes  Some recent data might be hidden     Patient questions:  Do you have a fever, pain , or abdominal swelling? Yes.   Pain Score  2 *- pt reports mild left sided pain. States it "feels better after she passes gas." Also stated she felt good yesterday, but that this morning she felt weaker. States she is feeling better this afternoon. RN instructed pt that she could try GasX if needed for the gas discomfort and to call back if pain worsens or changes. Pt agreeable to plan of care.   Have you tolerated food without any problems? Yes.    Have you been able to return to your normal activities? Yes.    Do you have any questions about your discharge instructions: Diet   No. Medications  No. Follow up visit  No.  Do you have questions or concerns about your Care? No.  Actions: * If pain score is 4 or above: No action needed, pain <4.  1. Have you developed a fever since your procedure? no  2.   Have you had an respiratory symptoms (SOB or cough) since your procedure? no  3.   Have you tested positive for COVID 19 since your procedure no  4.   Have you had any family members/close contacts diagnosed with the COVID 19 since your procedure?  no   If yes to any of these questions please route to Joylene John, RN and Joella Prince, RN

## 2020-05-27 NOTE — Telephone Encounter (Signed)
Attempted f/u phone call. No answer. Left message. °

## 2020-06-11 ENCOUNTER — Encounter: Payer: Self-pay | Admitting: Gastroenterology

## 2020-07-23 ENCOUNTER — Other Ambulatory Visit: Payer: Self-pay | Admitting: Internal Medicine

## 2020-07-23 NOTE — Telephone Encounter (Signed)
Check [place] registry last filled 04/26/2020.Marland KitchenJohny Tucker

## 2020-11-24 ENCOUNTER — Other Ambulatory Visit: Payer: Self-pay | Admitting: Internal Medicine

## 2020-11-24 DIAGNOSIS — Z1231 Encounter for screening mammogram for malignant neoplasm of breast: Secondary | ICD-10-CM

## 2020-12-18 ENCOUNTER — Other Ambulatory Visit: Payer: Self-pay | Admitting: Internal Medicine

## 2020-12-28 ENCOUNTER — Other Ambulatory Visit: Payer: Self-pay | Admitting: Internal Medicine

## 2020-12-28 ENCOUNTER — Ambulatory Visit
Admission: RE | Admit: 2020-12-28 | Discharge: 2020-12-28 | Disposition: A | Discharge status: Home / Self Care | Payer: Medicare HMO | Source: Ambulatory Visit

## 2020-12-28 DIAGNOSIS — Z1231 Encounter for screening mammogram for malignant neoplasm of breast: Secondary | ICD-10-CM | POA: Diagnosis not present

## 2021-01-02 ENCOUNTER — Encounter: Payer: Self-pay | Admitting: Internal Medicine

## 2021-01-02 NOTE — Progress Notes (Signed)
Subjective:    Patient ID: Amy Tucker, female    DOB: [birth year], 75 y.o.   MRN: [id number]   This visit occurred during the SARS-CoV-2 public health emergency.  Safety protocols were in place, including screening questions prior to the visit, additional usage of staff PPE, and extensive cleaning of exam room while observing appropriate contact time as indicated for disinfecting solutions.    HPI She is here for a physical exam.   She denies any changes in her health.    Medications and allergies reviewed with patient and updated if appropriate.  Patient Active Problem List   Diagnosis Date Noted   Anxiety 02/12/2015   Hyperlipidemia 08/02/2007   Osteopenia 08/02/2007   Hypothyroidism 07/31/2006   Prediabetes 07/31/2006    Current Outpatient Medications on File Prior to Visit  Medication Sig Dispense Refill   Cholecalciferol (VITAMIN D) 2000 units CAPS Take by mouth.     ELDERBERRY PO Take by mouth.     NON FORMULARY      OVER THE COUNTER MEDICATION CBD gummies     pravastatin (PRAVACHOL) 40 MG tablet Take 1 tablet by mouth once daily 90 tablet 0   Probiotic Product ([employer] ADV PROBIOTIC GUMMIES PO) Take by mouth daily.     SYNTHROID 75 MCG tablet TAKE 1 TABLET BY MOUTH ONCE DAILY EXCEPT ON WEDNESDAY, TAKE 1 AND 1/2 TABLET ON WEDNESDAY 102 tablet 0   No current facility-administered medications on file prior to visit.    Past Medical History:  Diagnosis Date   Anxiety    GERD (gastroesophageal reflux disease)    Heart murmur    Osteopenia    BMD as per Dr Molli Posey, Gyn   Other abnormal glucose    elevated FBS   Other and unspecified hyperlipidemia    NMR lipoprofile 2004: LDL 169 (2322/1419), TG 130, LDL goal =<100   Unspecified hypothyroidism     Past Surgical History:  Procedure Laterality Date   COLONOSCOPY  02/15/2016   Nandigam   DILATION AND CURETTAGE OF UTERUS  1987   EXTERNAL FIXATION ANKLE FRACTURE  1997   post fall (no surgery)    TONSILLECTOMY AND ADENOIDECTOMY  1953    Social History   Socioeconomic History   Marital status: Married    Spouse name: Not on file   Number of children: Not on file   Years of education: Not on file   Highest education level: Not on file  Occupational History   Occupation: auditor    Employer: [employer]  Tobacco Use   Smoking status: Former    Packs/day: 2.50    Years: 20.00    Pack years: 50.00    Types: Cigarettes    Quit date: 01/23/1990    Years since quitting: 30.9   Smokeless tobacco: Never   Tobacco comments:    pt smoked from age [age]  (37- 1992),up to 2 ppd  Vaping Use   Vaping Use: Never used  Substance and Sexual Activity   Alcohol use: Not Currently    Comment: Rarely   Drug use: No   Sexual activity: Not on file  Other Topics Concern   Not on file  Social History Narrative   Regular exercise- walks dog 2/day - total 45 min   Low fat, carb      Social Determinants of Health   Financial Resource Strain: Low Risk    Difficulty of Paying Living Expenses: Not hard at all  Food Insecurity: No  Food Insecurity   Worried About Charity fundraiser in the Last Year: Never true   Ran Out of Food in the Last Year: Never true  Transportation Needs: No Transportation Needs   Lack of Transportation (Medical): No   Lack of Transportation (Non-Medical): No  Physical Activity: Sufficiently Active   Days of Exercise per Week: 7 days   Minutes of Exercise per Session: 30 min  Stress: Stress Concern Present   Feeling of Stress : To some extent  Social Connections: Not on file    Family History  Problem Relation Age of Onset   Thyroid disease Maternal Aunt        goiter   Diabetes Other        17 M uncles & M aunts   Leukemia Father    Diabetes Mother    Hypertension Mother    Coronary artery disease Mother    Heart attack Mother 52   Stroke Mother        > 39   Rheum arthritis Mother    Kidney failure Mother    Hypertension Brother     Other Paternal Uncle    Alzheimer's disease Paternal Aunt    Cirrhosis Paternal Uncle    Heart attack Maternal Uncle 47   Colon cancer Neg Hx    Esophageal cancer Neg Hx    Rectal cancer Neg Hx    Stomach cancer Neg Hx    Colon polyps Neg Hx     Review of Systems  Constitutional:  Negative for chills and fever.  Eyes:  Negative for visual disturbance.  Respiratory:  Negative for cough, shortness of breath and wheezing.   Cardiovascular:  Negative for chest pain, palpitations and leg swelling.  Gastrointestinal:  Negative for abdominal pain, constipation, diarrhea and nausea.       Occ gerd  Genitourinary:  Negative for dysuria and hematuria.  Musculoskeletal:  Positive for arthralgias (knees). Negative for back pain.  Skin:  Negative for color change and rash.  Neurological:  Positive for headaches (occ - stress or weather - nothing new). Negative for light-headedness.  Psychiatric/Behavioral:  Positive for dysphoric mood (some days - mild). The patient is nervous/anxious.       Objective:   Vitals:   01/03/21 0808  BP: 114/80  Pulse: (!) 57  Temp: 98 F (36.7 C)  SpO2: 99%   Filed Weights   01/03/21 0808  Weight: 135 lb (61.2 kg)   Body mass index is 22.12 kg/m.  BP Readings from Last 3 Encounters:  01/03/21 114/80  05/25/20 (!) 132/58  12/31/19 136/84    Wt Readings from Last 3 Encounters:  01/03/21 135 lb (61.2 kg)  05/25/20 137 lb (62.1 kg)  05/03/20 137 lb (62.1 kg)    Depression screen [facility] 2/9 04/12/2020 12/30/2018 12/25/2016 05/22/2015 10/24/2012  Decreased Interest 0 0 0 0 0  Down, Depressed, Hopeless 1 0 0 0 0  PHQ - 2 Score 1 0 0 0 0     No flowsheet data found.     Physical Exam Constitutional: She appears well-developed and well-nourished. No distress.  HENT:  Head: Normocephalic and atraumatic.  Right Ear: External ear normal. Normal ear canal and TM Left Ear: External ear normal.  Normal ear canal and TM Mouth/Throat: Oropharynx is clear  and moist.  Eyes: Conjunctivae and EOM are normal.  Neck: Neck supple. No tracheal deviation present. No thyromegaly present.  No carotid bruit  Cardiovascular: Normal rate, regular rhythm and normal heart  sounds.   2/6 systolic murmur heard RSB.  No edema. Pulmonary/Chest: Effort normal and breath sounds normal. No respiratory distress. She has no wheezes. She has no rales.  Breast: deferred   Abdominal: Soft. She exhibits no distension. There is no tenderness.  Lymphadenopathy: She has no cervical adenopathy.  Skin: Skin is warm and dry. She is not diaphoretic.  Psychiatric: She has a normal mood and affect. Her behavior is normal.     Lab Results  Component Value Date   WBC 4.3 12/31/2019   HGB 14.7 12/31/2019   HCT 44.7 12/31/2019   PLT 256.0 12/31/2019   GLUCOSE 94 12/31/2019   CHOL 246 (H) 12/31/2019   TRIG 185.0 (H) 12/31/2019   HDL 51.20 12/31/2019   LDLDIRECT 125.0 11/09/2014   LDLCALC 158 (H) 12/31/2019   ALT 13 12/31/2019   AST 21 12/31/2019   NA 138 12/31/2019   K 4.4 12/31/2019   CL 101 12/31/2019   CREATININE 0.79 12/31/2019   BUN 10 12/31/2019   CO2 31 12/31/2019   TSH 2.49 12/31/2019   HGBA1C 5.7 12/31/2019         Assessment & Plan:   Physical exam: Screening blood work  ordered Exercise  walking dogs twice a day Weight  good Substance abuse  none   Reviewed recommended immunizations.   Health Maintenance  Topic Date Due   DEXA SCAN  12/28/2019   COVID-19 Vaccine (3 - Booster for Pfizer series) 01/19/2021 (Originally 12/03/2019)   Zoster Vaccines- Shingrix (1 of 2) 04/03/2021 (Originally 03/02/1995)   INFLUENZA VACCINE  04/22/2021 (Originally 08/23/2020)   Pneumonia Vaccine 38+ Years old (1 - PCV) 01/03/2022 (Originally 03/01/2010)   TETANUS/TDAP  06/22/2023   COLONOSCOPY (Pts 45-23yrs Insurance coverage will need to be confirmed)  05/25/2025   Hepatitis C Screening  Completed   HPV VACCINES  Aged Out          See Problem List for  Assessment and Plan of chronic medical problems.

## 2021-01-02 NOTE — Patient Instructions (Addendum)
Blood work was ordered.     Medications changes include :   None    Please followup in 1 year   Health Maintenance, Female Adopting a healthy lifestyle and getting preventive care are important in promoting health and wellness. Ask your health care provider about: The right schedule for you to have regular tests and exams. Things you can do on your own to prevent diseases and keep yourself healthy. What should I know about diet, weight, and exercise? Eat a healthy diet  Eat a diet that includes plenty of vegetables, fruits, low-fat dairy products, and lean protein. Do not eat a lot of foods that are high in solid fats, added sugars, or sodium. Maintain a healthy weight Body mass index (BMI) is used to identify weight problems. It estimates body fat based on height and weight. Your health care provider can help determine your BMI and help you achieve or maintain a healthy weight. Get regular exercise Get regular exercise. This is one of the most important things you can do for your health. Most adults should: Exercise for at least 150 minutes each week. The exercise should increase your heart rate and make you sweat (moderate-intensity exercise). Do strengthening exercises at least twice a week. This is in addition to the moderate-intensity exercise. Spend less time sitting. Even light physical activity can be beneficial. Watch cholesterol and blood lipids Have your blood tested for lipids and cholesterol at 75 years of age, then have this test every 5 years. Have your cholesterol levels checked more often if: Your lipid or cholesterol levels are high. You are older than 76 years of age. You are at high risk for heart disease. What should I know about cancer screening? Depending on your health history and family history, you may need to have cancer screening at various ages. This may include screening for: Breast cancer. Cervical cancer. Colorectal cancer. Skin cancer. Lung  cancer. What should I know about heart disease, diabetes, and high blood pressure? Blood pressure and heart disease High blood pressure causes heart disease and increases the risk of stroke. This is more likely to develop in people who have high blood pressure readings or are overweight. Have your blood pressure checked: Every 3-5 years if you are 19-50 years of age. Every year if you are 51 years old or older. Diabetes Have regular diabetes screenings. This checks your fasting blood sugar level. Have the screening done: Once every three years after age 59 if you are at a normal weight and have a low risk for diabetes. More often and at a younger age if you are overweight or have a high risk for diabetes. What should I know about preventing infection? Hepatitis B If you have a higher risk for hepatitis B, you should be screened for this virus. Talk with your health care provider to find out if you are at risk for hepatitis B infection. Hepatitis C Testing is recommended for: Everyone born from [birth year] through 1965. Anyone with known risk factors for hepatitis C. Sexually transmitted infections (STIs) Get screened for STIs, including gonorrhea and chlamydia, if: You are sexually active and are younger than 75 years of age. You are older than 75 years of age and your health care provider tells you that you are at risk for this type of infection. Your sexual activity has changed since you were last screened, and you are at increased risk for chlamydia or gonorrhea. Ask your health care provider if you are at risk. Ask your  health care provider about whether you are at high risk for HIV. Your health care provider may recommend a prescription medicine to help prevent HIV infection. If you choose to take medicine to prevent HIV, you should first get tested for HIV. You should then be tested every 3 months for as long as you are taking the medicine. Pregnancy If you are about to stop having your  period (premenopausal) and you may become pregnant, seek counseling before you get pregnant. Take 400 to 800 micrograms (mcg) of folic acid every day if you become pregnant. Ask for birth control (contraception) if you want to prevent pregnancy. Osteoporosis and menopause Osteoporosis is a disease in which the bones lose minerals and strength with aging. This can result in bone fractures. If you are 57 years old or older, or if you are at risk for osteoporosis and fractures, ask your health care provider if you should: Be screened for bone loss. Take a calcium or vitamin D supplement to lower your risk of fractures. Be given hormone replacement therapy (HRT) to treat symptoms of menopause. Follow these instructions at home: Alcohol use Do not drink alcohol if: Your health care provider tells you not to drink. You are pregnant, may be pregnant, or are planning to become pregnant. If you drink alcohol: Limit how much you have to: 0-1 drink a day. Know how much alcohol is in your drink. In the [place] one drink equals one 12 oz bottle of beer (355 mL), one 5 oz glass of wine (148 mL), or one 1 oz glass of hard liquor (44 mL). Lifestyle Do not use any products that contain nicotine or tobacco. These products include cigarettes, chewing tobacco, and vaping devices, such as e-cigarettes. If you need help quitting, ask your health care provider. Do not use street drugs. Do not share needles. Ask your health care provider for help if you need support or information about quitting drugs. General instructions Schedule regular health, dental, and eye exams. Stay current with your vaccines. Tell your health care provider if: You often feel depressed. You have ever been abused or do not feel safe at home. Summary Adopting a healthy lifestyle and getting preventive care are important in promoting health and wellness. Follow your health care provider's instructions about healthy diet, exercising, and  getting tested or screened for diseases. Follow your health care provider's instructions on monitoring your cholesterol and blood pressure. This information is not intended to replace advice given to you by your health care provider. Make sure you discuss any questions you have with your health care provider. Document Revised: 05/31/2020 Document Reviewed: 05/31/2020 Elsevier Patient Education  [place].

## 2021-01-03 ENCOUNTER — Ambulatory Visit (INDEPENDENT_AMBULATORY_CARE_PROVIDER_SITE_OTHER): Payer: Medicare HMO | Admitting: Internal Medicine

## 2021-01-03 ENCOUNTER — Other Ambulatory Visit: Payer: Self-pay

## 2021-01-03 VITALS — BP 114/80 | HR 57 | Temp 98.0°F | Ht 65.5 in | Wt 135.0 lb

## 2021-01-03 DIAGNOSIS — R7303 Prediabetes: Secondary | ICD-10-CM

## 2021-01-03 DIAGNOSIS — E7849 Other hyperlipidemia: Secondary | ICD-10-CM | POA: Diagnosis not present

## 2021-01-03 DIAGNOSIS — Z0000 Encounter for general adult medical examination without abnormal findings: Secondary | ICD-10-CM | POA: Diagnosis not present

## 2021-01-03 DIAGNOSIS — F419 Anxiety disorder, unspecified: Secondary | ICD-10-CM

## 2021-01-03 DIAGNOSIS — E039 Hypothyroidism, unspecified: Secondary | ICD-10-CM | POA: Diagnosis not present

## 2021-01-03 DIAGNOSIS — M85851 Other specified disorders of bone density and structure, right thigh: Secondary | ICD-10-CM | POA: Diagnosis not present

## 2021-01-03 LAB — CBC WITH DIFFERENTIAL/PLATELET
Basophils Absolute: 0 10*3/uL (ref 0.0–0.1)
Basophils Relative: 0.4 % (ref 0.0–3.0)
Eosinophils Absolute: 0.1 10*3/uL (ref 0.0–0.7)
Eosinophils Relative: 2.3 % (ref 0.0–5.0)
HCT: 44.9 % (ref 36.0–46.0)
Hemoglobin: 14.5 g/dL (ref 12.0–15.0)
Lymphocytes Relative: 20.4 % (ref 12.0–46.0)
Lymphs Abs: 1.1 10*3/uL (ref 0.7–4.0)
MCHC: 32.3 g/dL (ref 30.0–36.0)
MCV: 92.9 fl (ref 78.0–100.0)
Monocytes Absolute: 0.3 10*3/uL (ref 0.1–1.0)
Monocytes Relative: 6 % (ref 3.0–12.0)
Neutro Abs: 4 10*3/uL (ref 1.4–7.7)
Neutrophils Relative %: 70.9 % (ref 43.0–77.0)
Platelets: 243 10*3/uL (ref 150.0–400.0)
RBC: 4.83 Mil/uL (ref 3.87–5.11)
RDW: 13.4 % (ref 11.5–15.5)
WBC: 5.6 10*3/uL (ref 4.0–10.5)

## 2021-01-03 LAB — LIPID PANEL
Cholesterol: 212 mg/dL — ABNORMAL HIGH (ref 0–200)
HDL: 52.3 mg/dL (ref >39.00)
LDL Cholesterol: 129 mg/dL — ABNORMAL HIGH (ref 0–99)
NonHDL: 159.48
Total CHOL/HDL Ratio: 4
Triglycerides: 153 mg/dL — ABNORMAL HIGH (ref 0.0–149.0)
VLDL: 30.6 mg/dL (ref 0.0–40.0)

## 2021-01-03 LAB — COMPREHENSIVE METABOLIC PANEL
ALT: 12 U/L (ref 0–35)
AST: 18 U/L (ref 0–37)
Albumin: 4.2 g/dL (ref 3.5–5.2)
Alkaline Phosphatase: 71 U/L (ref 39–117)
BUN: 8 mg/dL (ref 6–23)
CO2: 29 mEq/L (ref 19–32)
Calcium: 9.5 mg/dL (ref 8.4–10.5)
Chloride: 102 mEq/L (ref 96–112)
Creatinine, Ser: 0.75 mg/dL (ref 0.40–1.20)
GFR: 77.65 mL/min (ref >60.00)
Glucose, Bld: 95 mg/dL (ref 70–99)
Potassium: 4.9 mEq/L (ref 3.5–5.1)
Sodium: 138 mEq/L (ref 135–145)
Total Bilirubin: 0.6 mg/dL (ref 0.2–1.2)
Total Protein: 6.7 g/dL (ref 6.0–8.3)

## 2021-01-03 LAB — HEMOGLOBIN A1C: Hgb A1c MFr Bld: 5.8 % (ref 4.6–6.5)

## 2021-01-03 LAB — TSH: TSH: 1.32 u[IU]/mL (ref 0.35–5.50)

## 2021-01-03 MED ORDER — ALPRAZOLAM 0.25 MG PO TABS: ORAL_TABLET | ORAL | 0 refills | Status: DC

## 2021-01-03 NOTE — Assessment & Plan Note (Signed)
Chronic  Clinically euthyroid Currently taking Synthroid 75 mcg daily 6 days a week, 112.5 micrograms daily 1 day a week Check tsh  Titrate med dose if needed

## 2021-01-03 NOTE — Assessment & Plan Note (Signed)
Chronic Intermittently Occasionally takes Xanax Continue Xanax 0.125-0.25 mg daily as needed

## 2021-01-03 NOTE — Assessment & Plan Note (Addendum)
Chronic Check lipid panel  Continue pravastatin 40 mg daily - if cholesterol is high she is ok with changing to a different medication Regular exercise and healthy diet encouraged

## 2021-01-03 NOTE — Assessment & Plan Note (Addendum)
Chronic DEXA due-ordered Calcium and vitamin D daily Stressed regular exercise-she walks.

## 2021-01-03 NOTE — Assessment & Plan Note (Signed)
Chronic Check a1c Low sugar / carb diet Stressed regular exercise  

## 2021-02-07 ENCOUNTER — Ambulatory Visit (INDEPENDENT_AMBULATORY_CARE_PROVIDER_SITE_OTHER)
Admission: RE | Admit: 2021-02-07 | Discharge: 2021-02-07 | Disposition: A | Discharge status: Home / Self Care | Payer: Medicare HMO | Source: Ambulatory Visit | Attending: Internal Medicine | Admitting: Internal Medicine

## 2021-02-07 ENCOUNTER — Other Ambulatory Visit: Payer: Self-pay

## 2021-02-07 DIAGNOSIS — M85851 Other specified disorders of bone density and structure, right thigh: Secondary | ICD-10-CM

## 2021-02-09 ENCOUNTER — Encounter: Payer: Self-pay | Admitting: Internal Medicine

## 2021-02-09 NOTE — Progress Notes (Signed)
Subjective:    Patient ID: Amy Tucker, female    DOB: July 21, 1945, 76 y.o.   MRN: [id number]  This visit occurred during the SARS-CoV-2 public health emergency.  Safety protocols were in place, including screening questions prior to the visit, additional usage of staff PPE, and extensive cleaning of exam room while observing appropriate contact time as indicated for disinfecting solutions.    HPI The patient is here for an acute visit.   Lower back pain and right leg pain -  it started christmas eve - it started in her lower back.  After a couple of day she had pin in her right buttock down to her leg.  The right lower leg feels crampy.  If she coughs , sneeze, going to the bathroom it hurts.    A couple of times some tingling in right foot.  No leg weakness. No changes in bowel / bladder.   She has been taking tylenol and advil/tylenol, motrin and pain cream.  She has had lower back pain in the past but no radiculopathy.     Medications and allergies reviewed with patient and updated if appropriate.  Patient Active Problem List   Diagnosis Date Noted   Low back pain radiating to right lower extremity 02/10/2021   Anxiety 02/12/2015   Hyperlipidemia 08/02/2007   Osteopenia 08/02/2007   Hypothyroidism 07/31/2006   Prediabetes 07/31/2006    Current Outpatient Medications on File Prior to Visit  Medication Sig Dispense Refill   ALPRAZolam (XANAX) 0.25 MG tablet TAKE 1/2 (ONE-HALF) TABLET BY MOUTH EVERY 8 HOURS AS NEEDED 60 tablet 0   Cholecalciferol (VITAMIN D) 2000 units CAPS Take by mouth.     ELDERBERRY PO Take by mouth.     NON FORMULARY      OVER THE COUNTER MEDICATION CBD gummies     pravastatin (PRAVACHOL) 40 MG tablet Take 1 tablet by mouth once daily 90 tablet 0   Probiotic Product ([employer] ADV PROBIOTIC GUMMIES PO) Take by mouth daily.     SYNTHROID 75 MCG tablet TAKE 1 TABLET BY MOUTH ONCE DAILY EXCEPT ON WEDNESDAY, TAKE 1 AND 1/2 TABLET ON WEDNESDAY 102 tablet 0    No current facility-administered medications on file prior to visit.    Past Medical History:  Diagnosis Date   Anxiety    GERD (gastroesophageal reflux disease)    Heart murmur    Osteopenia    BMD as per Dr Molli Posey, Gyn   Other abnormal glucose    elevated FBS   Other and unspecified hyperlipidemia    NMR lipoprofile 2004: LDL 169 (2322/1419), TG 130, LDL goal =<100   Unspecified hypothyroidism     Past Surgical History:  Procedure Laterality Date   COLONOSCOPY  02/15/2016   Nandigam   DILATION AND CURETTAGE OF UTERUS  1987   EXTERNAL FIXATION ANKLE FRACTURE  1997   post fall (no surgery)   TONSILLECTOMY AND ADENOIDECTOMY  1953    Social History   Socioeconomic History   Marital status: Married    Spouse name: Not on file   Number of children: Not on file   Years of education: Not on file   Highest education level: Not on file  Occupational History   Occupation: auditor    Employer: [employer]  Tobacco Use   Smoking status: Former    Packs/day: 2.50    Years: 20.00    Pack years: 50.00    Types: Cigarettes    Quit  date: 01/23/1990    Years since quitting: 31.0   Smokeless tobacco: Never   Tobacco comments:    pt smoked from age [age]  (30- 1992),up to 2 ppd  Vaping Use   Vaping Use: Never used  Substance and Sexual Activity   Alcohol use: Not Currently    Comment: Rarely   Drug use: No   Sexual activity: Not on file  Other Topics Concern   Not on file  Social History Narrative   Regular exercise- walks dog 2/day - total 45 min   Low fat, carb      Social Determinants of Health   Financial Resource Strain: Low Risk    Difficulty of Paying Living Expenses: Not hard at all  Food Insecurity: No Food Insecurity   Worried About Charity fundraiser in the Last Year: Never true   March ARB in the Last Year: Never true  Transportation Needs: No Transportation Needs   Lack of Transportation (Medical): No   Lack of  Transportation (Non-Medical): No  Physical Activity: Sufficiently Active   Days of Exercise per Week: 7 days   Minutes of Exercise per Session: 30 min  Stress: Stress Concern Present   Feeling of Stress : To some extent  Social Connections: Not on file    Family History  Problem Relation Age of Onset   Thyroid disease Maternal Aunt        goiter   Diabetes Other        58 M uncles & M aunts   Leukemia Father    Diabetes Mother    Hypertension Mother    Coronary artery disease Mother    Heart attack Mother 6   Stroke Mother        > 2   Rheum arthritis Mother    Kidney failure Mother    Hypertension Brother    Other Paternal Uncle    Alzheimer's disease Paternal Aunt    Cirrhosis Paternal Uncle    Heart attack Maternal Uncle 11   Colon cancer Neg Hx    Esophageal cancer Neg Hx    Rectal cancer Neg Hx    Stomach cancer Neg Hx    Colon polyps Neg Hx     Review of Systems     Objective:   Vitals:   02/10/21 1129  BP: 122/84  Pulse: 65  Temp: 98.2 F (36.8 C)  SpO2: 99%   BP Readings from Last 3 Encounters:  02/10/21 122/84  01/03/21 114/80  05/25/20 (!) 132/58   Wt Readings from Last 3 Encounters:  02/10/21 135 lb 6.4 oz (61.4 kg)  01/03/21 135 lb (61.2 kg)  05/25/20 137 lb (62.1 kg)   Body mass index is 22.19 kg/m.   Physical Exam Constitutional:      General: She is not in acute distress.    Appearance: Normal appearance. She is not ill-appearing.  HENT:     Head: Normocephalic and atraumatic.  Musculoskeletal:        General: No swelling, tenderness (no l spine tendernness or right lower back tenderness) or deformity.     Right lower leg: No edema.     Left lower leg: No edema.  Skin:    General: Skin is warm and dry.  Neurological:     Mental Status: She is alert.     Sensory: No sensory deficit.     Motor: No weakness.     Comments: Positive right straight leg raise  Assessment & Plan:    See Problem List for  Assessment and Plan of chronic medical problems.

## 2021-02-10 ENCOUNTER — Ambulatory Visit (INDEPENDENT_AMBULATORY_CARE_PROVIDER_SITE_OTHER): Payer: Medicare HMO

## 2021-02-10 ENCOUNTER — Ambulatory Visit (INDEPENDENT_AMBULATORY_CARE_PROVIDER_SITE_OTHER): Payer: Medicare HMO | Admitting: Internal Medicine

## 2021-02-10 ENCOUNTER — Other Ambulatory Visit: Payer: Self-pay

## 2021-02-10 VITALS — BP 122/84 | HR 65 | Temp 98.2°F | Ht 65.5 in | Wt 135.4 lb

## 2021-02-10 DIAGNOSIS — M545 Low back pain, unspecified: Secondary | ICD-10-CM | POA: Diagnosis not present

## 2021-02-10 DIAGNOSIS — M48061 Spinal stenosis, lumbar region without neurogenic claudication: Secondary | ICD-10-CM | POA: Diagnosis not present

## 2021-02-10 DIAGNOSIS — M2578 Osteophyte, vertebrae: Secondary | ICD-10-CM | POA: Diagnosis not present

## 2021-02-10 DIAGNOSIS — M47816 Spondylosis without myelopathy or radiculopathy, lumbar region: Secondary | ICD-10-CM | POA: Diagnosis not present

## 2021-02-10 DIAGNOSIS — M79604 Pain in right leg: Secondary | ICD-10-CM | POA: Diagnosis not present

## 2021-02-10 DIAGNOSIS — M5441 Lumbago with sciatica, right side: Secondary | ICD-10-CM | POA: Diagnosis not present

## 2021-02-10 DIAGNOSIS — M4316 Spondylolisthesis, lumbar region: Secondary | ICD-10-CM | POA: Diagnosis not present

## 2021-02-10 MED ORDER — GABAPENTIN 100 MG PO CAPS: 100.0000 mg | ORAL_CAPSULE | Freq: Every day | ORAL | 3 refills | Status: DC

## 2021-02-10 MED ORDER — PREDNISONE 20 MG PO TABS: 40.0000 mg | ORAL_TABLET | Freq: Every day | ORAL | 0 refills | Status: AC

## 2021-02-10 NOTE — Patient Instructions (Signed)
You do have sciatica.   Have an xray downstairs.    Medications changes include :   prednisone 40 mg daily x 5 days,  gabapentin 100 mg at bedtime.   Your prescription(s) have been submitted to your pharmacy. Please take as directed and contact our office if you believe you are having problem(s) with the medication(s).    Sciatica Rehab Ask your health care provider which exercises are safe for you. Do exercises exactly as told by your health care provider and adjust them as directed. It is normal to feel mild stretching, pulling, tightness, or discomfort as you do these exercises. Stop right away if you feel sudden pain or your pain gets worse. Do not begin these exercises until told by your health care provider. Stretching and range-of-motion exercises These exercises warm up your muscles and joints and improve the movement and flexibility of your hips and back. These exercises also help to relieve pain, numbness, and tingling. Sciatic nerve glide Sit in a chair with your head facing down toward your chest. Place your hands behind your back. Let your shoulders slump forward. Slowly straighten one of your legs while you tilt your head back as if you are looking toward the ceiling. Only straighten your leg as far as you can without making your symptoms worse. Hold this position for __________ seconds. Slowly return to the starting position. Repeat with your other leg. Repeat __________ times. Complete this exercise __________ times a day. Knee to chest with hip adduction and internal rotation  Lie on your back on a firm surface with both legs straight. Bend one of your knees and move it up toward your chest until you feel a gentle stretch in your lower back and buttock. Then, move your knee toward the shoulder that is on the opposite side from your leg. This is hip adduction and internal rotation. Hold your leg in this position by holding on to the front of your knee. Hold this position  for __________ seconds. Slowly return to the starting position. Repeat with your other leg. Repeat __________ times. Complete this exercise __________ times a day. Prone extension on elbows  Lie on your abdomen on a firm surface. A bed may be too soft for this exercise. Prop yourself up on your elbows. Use your arms to help lift your chest up until you feel a gentle stretch in your abdomen and your lower back. This will place some of your body weight on your elbows. If this is uncomfortable, try stacking pillows under your chest. Your hips should stay down, against the surface that you are lying on. Keep your hip and back muscles relaxed. Hold this position for __________ seconds. Slowly relax your upper body and return to the starting position. Repeat __________ times. Complete this exercise __________ times a day. Strengthening exercises These exercises build strength and endurance in your back. Endurance is the ability to use your muscles for a long time, even after they get tired. Pelvic tilt This exercise strengthens the muscles that lie deep in the abdomen. Lie on your back on a firm surface. Bend your knees and keep your feet flat on the floor. Tense your abdominal muscles. Tip your pelvis up toward the ceiling and flatten your lower back into the floor. To help with this exercise, you may place a small towel under your lower back and try to push your back into the towel. Hold this position for __________ seconds. Let your muscles relax completely before you repeat this  exercise. Repeat __________ times. Complete this exercise __________ times a day. Alternating arm and leg raises  Get on your hands and knees on a firm surface. If you are on a hard floor, you may want to use padding, such as an exercise mat, to cushion your knees. Line up your arms and legs. Your hands should be directly below your shoulders, and your knees should be directly below your hips. Lift your left leg  behind you. At the same time, raise your right arm and straighten it in front of you. Do not lift your leg higher than your hip. Do not lift your arm higher than your shoulder. Keep your abdominal and back muscles tight. Keep your hips facing the ground. Do not arch your back. Keep your balance carefully, and do not hold your breath. Hold this position for __________ seconds. Slowly return to the starting position. Repeat with your right leg and your left arm. Repeat __________ times. Complete this exercise __________ times a day. Posture and body mechanics Good posture and healthy body mechanics can help to relieve stress in your body's tissues and joints. Body mechanics refers to the movements and positions of your body while you do your daily activities. Posture is part of body mechanics. Good posture means: Your spine is in its natural S-curve position (neutral). Your shoulders are pulled back slightly. Your head is not tipped forward. Follow these guidelines to improve your posture and body mechanics in your everyday activities. Standing  When standing, keep your spine neutral and your feet about hip width apart. Keep a slight bend in your knees. Your ears, shoulders, and hips should line up. When you do a task in which you stand in one place for a long time, place one foot up on a stable object that is 2-4 inches (5-10 cm) high, such as a footstool. This helps keep your spine neutral. Sitting  When sitting, keep your spine neutral and keep your feet flat on the floor. Use a footrest, if necessary, and keep your thighs parallel to the floor. Avoid rounding your shoulders, and avoid tilting your head forward. When working at a desk or a computer, keep your desk at a height where your hands are slightly lower than your elbows. Slide your chair under your desk so you are close enough to maintain good posture. When working at a computer, place your monitor at a height where you are looking  straight ahead and you do not have to tilt your head forward or downward to look at the screen. Resting When lying down and resting, avoid positions that are most painful for you. If you have pain with activities such as sitting, bending, stooping, or squatting, lie in a position in which your body does not bend very much. For example, avoid curling up on your side with your arms and knees near your chest (fetal position). If you have pain with activities such as standing for a long time or reaching with your arms, lie with your spine in a neutral position and bend your knees slightly. Try the following positions: Lying on your side with a pillow between your knees. Lying on your back with a pillow under your knees. Lifting  When lifting objects, keep your feet at least shoulder width apart and tighten your abdominal muscles. Bend your knees and hips and keep your spine neutral. It is important to lift using the strength of your legs, not your back. Do not lock your knees straight out. Always ask  for help to lift heavy or awkward objects. This information is not intended to replace advice given to you by your health care provider. Make sure you discuss any questions you have with your health care provider. Document Revised: 05/03/2018 Document Reviewed: 01/31/2018 Elsevier Patient Education  [place].

## 2021-02-10 NOTE — Assessment & Plan Note (Signed)
Acute Started > 3 weeks ago Lower back pain with radiation of pain down Right posterior leg - some tingling in foot at times, no weakness No change in bladder/bowel Discussed options Start prednisone 40 mg daily x 5 days Start gabapentin 100 mg nightly Xray of L spine today Deferred PT - advised home exercises

## 2021-02-11 ENCOUNTER — Encounter: Payer: Self-pay | Admitting: Internal Medicine

## 2021-02-11 DIAGNOSIS — I700 Atherosclerosis of aorta: Secondary | ICD-10-CM | POA: Insufficient documentation

## 2021-03-18 ENCOUNTER — Other Ambulatory Visit: Payer: Self-pay | Admitting: Internal Medicine

## 2021-03-22 ENCOUNTER — Other Ambulatory Visit: Payer: Self-pay | Admitting: Internal Medicine

## 2021-03-23 MED ORDER — SYNTHROID 75 MCG PO TABS: ORAL_TABLET | ORAL | 0 refills | Status: DC

## 2021-04-13 ENCOUNTER — Ambulatory Visit: Payer: Medicare HMO

## 2021-06-05 ENCOUNTER — Other Ambulatory Visit: Payer: Self-pay | Admitting: Internal Medicine

## 2021-06-06 MED ORDER — ALPRAZOLAM 0.25 MG PO TABS: ORAL_TABLET | ORAL | 0 refills | Status: DC

## 2021-06-14 ENCOUNTER — Other Ambulatory Visit: Payer: Self-pay | Admitting: Internal Medicine

## 2021-07-12 ENCOUNTER — Telehealth: Payer: Self-pay | Admitting: *Deleted

## 2021-07-12 MED ORDER — SYNTHROID 75 MCG PO TABS: ORAL_TABLET | ORAL | 0 refills | Status: DC

## 2021-07-12 NOTE — Telephone Encounter (Signed)
New prescription sent to pharmacy 

## 2021-08-22 ENCOUNTER — Telehealth: Payer: Self-pay

## 2021-08-22 ENCOUNTER — Ambulatory Visit: Payer: Medicare HMO

## 2021-08-22 NOTE — Telephone Encounter (Signed)
Called x 3 with no answer, all numbers, Patient may reschedule for next available visit.  L.Wilson,LPN

## 2021-09-06 ENCOUNTER — Other Ambulatory Visit: Payer: Self-pay | Admitting: Internal Medicine

## 2021-09-22 ENCOUNTER — Other Ambulatory Visit: Payer: Self-pay | Admitting: Internal Medicine

## 2021-09-22 MED ORDER — ALPRAZOLAM 0.25 MG PO TABS: ORAL_TABLET | ORAL | 0 refills | Status: DC

## 2021-09-22 NOTE — Telephone Encounter (Signed)
Check [place] registry last filled 06/06/2021.Marland KitchenJohny Chess

## 2021-10-10 ENCOUNTER — Ambulatory Visit (INDEPENDENT_AMBULATORY_CARE_PROVIDER_SITE_OTHER): Payer: Medicare HMO

## 2021-10-10 VITALS — Ht 65.0 in | Wt 134.0 lb

## 2021-10-10 DIAGNOSIS — Z0000 Encounter for general adult medical examination without abnormal findings: Secondary | ICD-10-CM

## 2021-10-10 NOTE — Progress Notes (Signed)
Subjective:   Amy Tucker is a 76 y.o. female who presents for Medicare Annual (Subsequent) preventive examination.   Virtual Visit via Telephone Note  I connected with  Amy Tucker on 10/10/21 at  1:00 PM EDT by telephone and verified that I am speaking with the correct person using two identifiers.  Location: Patient: home  Provider: [facility]  Persons participating in the virtual visit: patient/Nurse Health Advisor   I discussed the limitations, risks, security and privacy concerns of performing an evaluation and management service by telephone and the availability of in person appointments. The patient expressed understanding and agreed to proceed.  Interactive audio and video telecommunications were attempted between this nurse and patient, however failed, due to patient having technical difficulties OR patient did not have access to video capability.  We continued and completed visit with audio only.  Some vital signs may be absent or patient reported.   [name], LPN  Review of Systems     Cardiac Risk Factors include: advanced age (>53mn, >>60women)     Objective:    Today's Vitals   10/10/21 1301  Weight: 134 lb (60.8 kg)  Height: '5\' 5"'$  (1.651 m)   Body mass index is 22.3 kg/m.     10/10/2021    1:05 PM 04/12/2020    1:20 PM 02/15/2016    1:12 PM 02/04/2016    1:30 PM  Advanced Directives  Does Patient Have a Medical Advance Directive? Yes Yes No No  Type of AParamedicof [place] will Living will;Healthcare Power of AKittredgein Chart? No - copy requested No - copy requested      Current Medications (verified) Outpatient Encounter Medications as of 10/10/2021  Medication Sig   ALPRAZolam (XANAX) 0.25 MG tablet TAKE 1/2 (ONE-HALF) TABLET BY MOUTH EVERY 8 HOURS AS NEEDED   Cholecalciferol (VITAMIN D) 2000 units CAPS Take by mouth.   ELDERBERRY PO Take by mouth.   NON  FORMULARY    OVER THE COUNTER MEDICATION CBD gummies   pravastatin (PRAVACHOL) 40 MG tablet Take 1 tablet by mouth once daily   Probiotic Product ([employer] ADV PROBIOTIC GUMMIES PO) Take by mouth daily.   SYNTHROID 75 MCG tablet TAKE 1 TABLET BY MOUTH ONCE DAILY EXCEPT ON WEDNESDAY, TAKE 1 AND 1/2 TABLET ON WEDNESDAY   gabapentin (NEURONTIN) 100 MG capsule Take 1 capsule (100 mg total) by mouth at bedtime. (Patient not taking: Reported on 10/10/2021)   No facility-administered encounter medications on file as of 10/10/2021.    Allergies (verified) Ciprofloxacin, Meloxicam, Tramadol hcl, and Zocor [simvastatin]   History: Past Medical History:  Diagnosis Date   Anxiety    GERD (gastroesophageal reflux disease)    Heart murmur    Osteopenia    BMD as per Dr RMolli Posey Gyn   Other abnormal glucose    elevated FBS   Other and unspecified hyperlipidemia    NMR lipoprofile 2004: LDL 169 (2322/1419), TG 130, LDL goal =<100   Unspecified hypothyroidism    Past Surgical History:  Procedure Laterality Date   COLONOSCOPY  02/15/2016   Nandigam   DILATION AND CURETTAGE OF UTERUS  1987   EXTERNAL FIXATION ANKLE FRACTURE  1997   post fall (no surgery)   TONSILLECTOMY AND ADENOIDECTOMY  1953   Family History  Problem Relation Age of Onset   Thyroid disease Maternal Aunt        goiter  Diabetes Other        72 M uncles & M aunts   Leukemia Father    Diabetes Mother    Hypertension Mother    Coronary artery disease Mother    Heart attack Mother 36   Stroke Mother        > 60   Rheum arthritis Mother    Kidney failure Mother    Hypertension Brother    Other Paternal Uncle    Alzheimer's disease Paternal Aunt    Cirrhosis Paternal Uncle    Heart attack Maternal Uncle 80   Colon cancer Neg Hx    Esophageal cancer Neg Hx    Rectal cancer Neg Hx    Stomach cancer Neg Hx    Colon polyps Neg Hx    Social History   Socioeconomic History   Marital status: Married    Spouse  name: Not on file   Number of children: Not on file   Years of education: Not on file   Highest education level: Not on file  Occupational History   Occupation: auditor    Employer: [employer]  Tobacco Use   Smoking status: Former    Packs/day: 2.50    Years: 20.00    Total pack years: 50.00    Types: Cigarettes    Quit date: 01/23/1990    Years since quitting: 31.7   Smokeless tobacco: Never   Tobacco comments:    pt smoked from age [age]  (29- 1992),up to 2 ppd  Vaping Use   Vaping Use: Never used  Substance and Sexual Activity   Alcohol use: Not Currently    Comment: Rarely   Drug use: No   Sexual activity: Not on file  Other Topics Concern   Not on file  Social History Narrative   Regular exercise- walks dog 2/day - total 45 min   Low fat, carb      Social Determinants of Health   Financial Resource Strain: Low Risk  (10/10/2021)   Overall Financial Resource Strain (CARDIA)    Difficulty of Paying Living Expenses: Not hard at all  Food Insecurity: No Food Insecurity (10/10/2021)   Hunger Vital Sign    Worried About Running Out of Food in the Last Year: Never true    Ran Out of Food in the Last Year: Never true  Transportation Needs: No Transportation Needs (10/10/2021)   PRAPARE - Hydrologist (Medical): No    Lack of Transportation (Non-Medical): No  Physical Activity: Sufficiently Active (10/10/2021)   Exercise Vital Sign    Days of Exercise per Week: 5 days    Minutes of Exercise per Session: 30 min  Stress: No Stress Concern Present (10/10/2021)   [place]    Feeling of Stress : Only a little  Social Connections: Moderately Isolated (10/10/2021)   Social Connection and Isolation Panel [NHANES]    Frequency of Communication with Friends and Family: More than three times a week    Frequency of Social Gatherings with Friends and Family: More than three times  a week    Attends Religious Services: Never    Marine scientist or Organizations: No    Attends Archivist Meetings: Never    Marital Status: Married    Tobacco Counseling Counseling given: Not Answered Tobacco comments: pt smoked from age [age]  (1973- 1992),up to 2 ppd   Clinical Intake:  Pre-visit preparation completed:  Yes  Pain : No/denies pain     Nutritional Risks: None Diabetes: No  How often do you need to have someone help you when you read instructions, pamphlets, or other written materials from your doctor or pharmacy?: 1 - Never  Diabetic?no   Interpreter Needed?: No  Information entered by :: Jadene Pierini, LPN   Activities of Daily Living    10/10/2021    1:05 PM  In your present state of health, do you have any difficulty performing the following activities:  Hearing? 0  Vision? 0  Difficulty concentrating or making decisions? 0  Walking or climbing stairs? 0  Dressing or bathing? 0  Doing errands, shopping? 0  Preparing Food and eating ? N  Using the Toilet? N  In the past six months, have you accidently leaked urine? N  Do you have problems with loss of bowel control? N  Managing your Medications? N  Managing your Finances? N  Housekeeping or managing your Housekeeping? N    Patient Care Team: Binnie Rail, MD as PCP - General (Internal Medicine)  Indicate any recent Medical Services you may have received from other than [facility] providers in the past year (date may be approximate).     Assessment:   This is a routine wellness examination for [place].  Hearing/Vision screen Vision Screening - Comments:: Annual eye exams wears glasses   Dietary issues and exercise activities discussed: Current Exercise Habits: Home exercise routine, Type of exercise: walking, Time (Minutes): 30, Frequency (Times/Week): 5, Weekly Exercise (Minutes/Week): 150, Intensity: Mild, Exercise limited by: None identified   Goals Addressed              This Visit's Progress    Patient Stated   On track    To maintain my current health status by continuing to eat healthy and stay physically active.       Depression Screen    10/10/2021    1:04 PM 04/12/2020    1:38 PM 12/30/2018    7:55 AM 12/25/2016    2:16 PM 05/22/2015    8:17 AM 10/24/2012    8:23 AM  PHQ 2/9 Scores  PHQ - 2 Score 0 1 0 0 0 0    Fall Risk    10/10/2021    1:02 PM 04/12/2020    1:21 PM 12/31/2019    8:08 AM 12/30/2018    7:54 AM 12/25/2016    2:16 PM  Fall Risk   Falls in the past year? 0 0 0 1 No  Number falls in past yr: 0 0 0 0   Injury with Fall? 0 0 0 0   Risk for fall due to : No Fall Risks No Fall Risks No Fall Risks    Follow up Falls prevention discussed Falls evaluation completed Falls evaluation completed      FALL RISK PREVENTION PERTAINING TO THE HOME:  Any stairs in or around the home? Yes  If so, are there any without handrails? No  Home free of loose throw rugs in walkways, pet beds, electrical cords, etc? Yes  Adequate lighting in your home to reduce risk of falls? Yes   ASSISTIVE DEVICES UTILIZED TO PREVENT FALLS:  Life alert? No  Use of a cane, walker or w/c? No  Grab bars in the bathroom? Yes  Shower chair or bench in shower? Yes  Elevated toilet seat or a handicapped toilet? Yes        10/10/2021    1:05 PM  6CIT  Screen  What Year? 0 points  What month? 0 points  What time? 0 points  Count back from 20 0 points  Months in reverse 0 points  Repeat phrase 0 points  Total Score 0 points    Immunizations Immunization History  Administered Date(s) Administered   PFIZER(Purple Top)SARS-COV-2 Vaccination 09/12/2019, 10/08/2019   Td 01/23/2005, 06/21/2013    TDAP status: Up to date  Flu Vaccine status: Due, Education has been provided regarding the importance of this vaccine. Advised may receive this vaccine at local pharmacy or Health Dept. Aware to provide a copy of the vaccination record if obtained from local  pharmacy or Health Dept. Verbalized acceptance and understanding.  Pneumococcal vaccine status: Due, Education has been provided regarding the importance of this vaccine. Advised may receive this vaccine at local pharmacy or Health Dept. Aware to provide a copy of the vaccination record if obtained from local pharmacy or Health Dept. Verbalized acceptance and understanding.  Covid-19 vaccine status: Completed vaccines  Qualifies for Shingles Vaccine? Yes   Zostavax completed No   Shingrix Completed?: No.    Education has been provided regarding the importance of this vaccine. Patient has been advised to call insurance company to determine out of pocket expense if they have not yet received this vaccine. Advised may also receive vaccine at local pharmacy or Health Dept. Verbalized acceptance and understanding.  Screening Tests Health Maintenance  Topic Date Due   Zoster Vaccines- Shingrix (1 of 2) Never done   COVID-19 Vaccine (3 - Pfizer series) 12/03/2019   INFLUENZA VACCINE  Never done   Pneumonia Vaccine 90+ Years old (1 - PCV) 01/03/2022 (Originally 03/01/2010)   DEXA SCAN  02/08/2023   TETANUS/TDAP  06/22/2023   COLONOSCOPY (Pts 45-62yr Insurance coverage will need to be confirmed)  05/25/2025   Hepatitis C Screening  Completed   HPV VACCINES  Aged Out    Health Maintenance  Health Maintenance Due  Topic Date Due   Zoster Vaccines- Shingrix (1 of 2) Never done   COVID-19 Vaccine (3 - Pfizer series) 12/03/2019   INFLUENZA VACCINE  Never done    Colorectal cancer screening: No longer required.   Mammogram status: No longer required due to age.  Bone Density status: Completed 02/07/2021. Results reflect: Bone density results: OSTEOPOROSIS. Repeat every 2 years.  Lung Cancer Screening: (Low Dose CT Chest recommended if Age 76-80years, 30 pack-year currently smoking OR have quit w/in 15years.) does not qualify.   Lung Cancer Screening Referral: n/a  Additional  Screening:  Hepatitis C Screening: does not qualify;   Vision Screening: Recommended annual ophthalmology exams for early detection of glaucoma and other disorders of the eye. Is the patient up to date with their annual eye exam?  No  Who is the provider or what is the name of the office in which the patient attends annual eye exams? Dr.Jones  If pt is not established with a provider, would they like to be referred to a provider to establish care? No .   Dental Screening: Recommended annual dental exams for proper oral hygiene  Community Resource Referral / Chronic Care Management: CRR required this visit?  No   CCM required this visit?  No      Plan:     I have personally reviewed and noted the following in the patient's chart:   Medical and social history Use of alcohol, tobacco or illicit drugs  Current medications and supplements including opioid prescriptions. Patient is not currently taking opioid  prescriptions. Functional ability and status Nutritional status Physical activity Advanced directives List of other physicians Hospitalizations, surgeries, and ER visits in previous 12 months Vitals Screenings to include cognitive, depression, and falls Referrals and appointments  In addition, I have reviewed and discussed with patient certain preventive protocols, quality metrics, and best practice recommendations. A written personalized care plan for preventive services as well as general preventive health recommendations were provided to patient.     [name], LPN   3/57/0177   Nurse Notes: Patient declines further vaccines

## 2021-10-10 NOTE — Patient Instructions (Signed)
Amy Tucker , Thank you for taking time to come for your Medicare Wellness Visit. I appreciate your ongoing commitment to your health goals. Please review the following plan we discussed and let me know if I can assist you in the future.   Screening recommendations/referrals: Colonoscopy: no longer required  Mammogram: no longer required  Bone Density: 02/07/2021 Recommended yearly ophthalmology/optometry visit for glaucoma screening and checkup Recommended yearly dental visit for hygiene and checkup  Vaccinations: Influenza vaccine: declined  Pneumococcal vaccine: declined  Tdap vaccine: 05/25/2013 Shingles vaccine: declined    Covid-19:completed   Advanced directives: Please bring a copy of your health care power of attorney and living will to the office to be added to your chart at your convenience.   Conditions/risks identified: Aim for 30 minutes of exercise or brisk walking, 6-8 glasses of water, and 5 servings of fruits and vegetables each day.   Next appointment: Follow up in one year for your annual wellness visit    Preventive Care 65 Years and Older, Female Preventive care refers to lifestyle choices and visits with your health care provider that can promote health and wellness. What does preventive care include? A yearly physical exam. This is also called an annual well check. Dental exams once or twice a year. Routine eye exams. Ask your health care provider how often you should have your eyes checked. Personal lifestyle choices, including: Daily care of your teeth and gums. Regular physical activity. Eating a healthy diet. Avoiding tobacco and drug use. Limiting alcohol use. Practicing safe sex. Taking low-dose aspirin every day. Taking vitamin and mineral supplements as recommended by your health care provider. What happens during an annual well check? The services and screenings done by your health care provider during your annual well check will depend on your  age, overall health, lifestyle risk factors, and family history of disease. Counseling  Your health care provider may ask you questions about your: Alcohol use. Tobacco use. Drug use. Emotional well-being. Home and relationship well-being. Sexual activity. Eating habits. History of falls. Memory and ability to understand (cognition). Work and work Statistician. Reproductive health. Screening  You may have the following tests or measurements: Height, weight, and BMI. Blood pressure. Lipid and cholesterol levels. These may be checked every 5 years, or more frequently if you are over 75 years old. Skin check. Lung cancer screening. You may have this screening every year starting at age 14 if you have a 30-pack-year history of smoking and currently smoke or have quit within the past 15 years. Fecal occult blood test (FOBT) of the stool. You may have this test every year starting at age 53. Flexible sigmoidoscopy or colonoscopy. You may have a sigmoidoscopy every 5 years or a colonoscopy every 10 years starting at age 91. Hepatitis C blood test. Hepatitis B blood test. Sexually transmitted disease (STD) testing. Diabetes screening. This is done by checking your blood sugar (glucose) after you have not eaten for a while (fasting). You may have this done every 1-3 years. Bone density scan. This is done to screen for osteoporosis. You may have this done starting at age 54. Mammogram. This may be done every 1-2 years. Talk to your health care provider about how often you should have regular mammograms. Talk with your health care provider about your test results, treatment options, and if necessary, the need for more tests. Vaccines  Your health care provider may recommend certain vaccines, such as: Influenza vaccine. This is recommended every year. Tetanus, diphtheria, and  acellular pertussis (Tdap, Td) vaccine. You may need a Td booster every 10 years. Zoster vaccine. You may need this after  age 3. Pneumococcal 13-valent conjugate (PCV13) vaccine. One dose is recommended after age 6. Pneumococcal polysaccharide (PPSV23) vaccine. One dose is recommended after age 37. Talk to your health care provider about which screenings and vaccines you need and how often you need them. This information is not intended to replace advice given to you by your health care provider. Make sure you discuss any questions you have with your health care provider. Document Released: 02/05/2015 Document Revised: 09/29/2015 Document Reviewed: 11/10/2014 Elsevier Interactive Patient Education  2017 [place] Prevention in the Home Falls can cause injuries. They can happen to people of all ages. There are many things you can do to make your home safe and to help prevent falls. What can I do on the outside of my home? Regularly fix the edges of walkways and driveways and fix any cracks. Remove anything that might make you trip as you walk through a door, such as a raised step or threshold. Trim any bushes or trees on the path to your home. Use bright outdoor lighting. Clear any walking paths of anything that might make someone trip, such as rocks or tools. Regularly check to see if handrails are loose or broken. Make sure that both sides of any steps have handrails. Any raised decks and porches should have guardrails on the edges. Have any leaves, snow, or ice cleared regularly. Use sand or salt on walking paths during winter. Clean up any spills in your garage right away. This includes oil or grease spills. What can I do in the bathroom? Use night lights. Install grab bars by the toilet and in the tub and shower. Do not use towel bars as grab bars. Use non-skid mats or decals in the tub or shower. If you need to sit down in the shower, use a plastic, non-slip stool. Keep the floor dry. Clean up any water that spills on the floor as soon as it happens. Remove soap buildup in the tub or shower  regularly. Attach bath mats securely with double-sided non-slip rug tape. Do not have throw rugs and other things on the floor that can make you trip. What can I do in the bedroom? Use night lights. Make sure that you have a light by your bed that is easy to reach. Do not use any sheets or blankets that are too big for your bed. They should not hang down onto the floor. Have a firm chair that has side arms. You can use this for support while you get dressed. Do not have throw rugs and other things on the floor that can make you trip. What can I do in the kitchen? Clean up any spills right away. Avoid walking on wet floors. Keep items that you use a lot in easy-to-reach places. If you need to reach something above you, use a strong step stool that has a grab bar. Keep electrical cords out of the way. Do not use floor polish or wax that makes floors slippery. If you must use wax, use non-skid floor wax. Do not have throw rugs and other things on the floor that can make you trip. What can I do with my stairs? Do not leave any items on the stairs. Make sure that there are handrails on both sides of the stairs and use them. Fix handrails that are broken or loose. Make sure that  handrails are as long as the stairways. Check any carpeting to make sure that it is firmly attached to the stairs. Fix any carpet that is loose or worn. Avoid having throw rugs at the top or bottom of the stairs. If you do have throw rugs, attach them to the floor with carpet tape. Make sure that you have a light switch at the top of the stairs and the bottom of the stairs. If you do not have them, ask someone to add them for you. What else can I do to help prevent falls? Wear shoes that: Do not have high heels. Have rubber bottoms. Are comfortable and fit you well. Are closed at the toe. Do not wear sandals. If you use a stepladder: Make sure that it is fully opened. Do not climb a closed stepladder. Make sure that  both sides of the stepladder are locked into place. Ask someone to hold it for you, if possible. Clearly mark and make sure that you can see: Any grab bars or handrails. First and last steps. Where the edge of each step is. Use tools that help you move around (mobility aids) if they are needed. These include: Canes. Walkers. Scooters. Crutches. Turn on the lights when you go into a dark area. Replace any light bulbs as soon as they burn out. Set up your furniture so you have a clear path. Avoid moving your furniture around. If any of your floors are uneven, fix them. If there are any pets around you, be aware of where they are. Review your medicines with your doctor. Some medicines can make you feel dizzy. This can increase your chance of falling. Ask your doctor what other things that you can do to help prevent falls. This information is not intended to replace advice given to you by your health care provider. Make sure you discuss any questions you have with your health care provider. Document Released: 11/05/2008 Document Revised: 06/17/2015 Document Reviewed: 02/13/2014 Elsevier Interactive Patient Education  2017 [employer].

## 2022-01-04 ENCOUNTER — Encounter: Payer: Self-pay | Admitting: Internal Medicine

## 2022-01-04 ENCOUNTER — Ambulatory Visit (INDEPENDENT_AMBULATORY_CARE_PROVIDER_SITE_OTHER): Payer: Medicare HMO | Admitting: Internal Medicine

## 2022-01-04 VITALS — BP 124/72 | HR 74 | Temp 97.9°F | Ht 65.0 in | Wt 140.0 lb

## 2022-01-04 DIAGNOSIS — E559 Vitamin D deficiency, unspecified: Secondary | ICD-10-CM

## 2022-01-04 DIAGNOSIS — M545 Low back pain, unspecified: Secondary | ICD-10-CM | POA: Diagnosis not present

## 2022-01-04 DIAGNOSIS — M85851 Other specified disorders of bone density and structure, right thigh: Secondary | ICD-10-CM | POA: Diagnosis not present

## 2022-01-04 DIAGNOSIS — Z0000 Encounter for general adult medical examination without abnormal findings: Secondary | ICD-10-CM | POA: Diagnosis not present

## 2022-01-04 DIAGNOSIS — E039 Hypothyroidism, unspecified: Secondary | ICD-10-CM

## 2022-01-04 DIAGNOSIS — E7849 Other hyperlipidemia: Secondary | ICD-10-CM

## 2022-01-04 DIAGNOSIS — I700 Atherosclerosis of aorta: Secondary | ICD-10-CM

## 2022-01-04 DIAGNOSIS — F419 Anxiety disorder, unspecified: Secondary | ICD-10-CM

## 2022-01-04 DIAGNOSIS — R7303 Prediabetes: Secondary | ICD-10-CM | POA: Diagnosis not present

## 2022-01-04 DIAGNOSIS — M79604 Pain in right leg: Secondary | ICD-10-CM

## 2022-01-04 LAB — CBC WITH DIFFERENTIAL/PLATELET
Basophils Absolute: 0 10*3/uL (ref 0.0–0.1)
Basophils Relative: 0.6 % (ref 0.0–3.0)
Eosinophils Absolute: 0.2 10*3/uL (ref 0.0–0.7)
Eosinophils Relative: 3.3 % (ref 0.0–5.0)
HCT: 44.8 % (ref 36.0–46.0)
Hemoglobin: 15 g/dL (ref 12.0–15.0)
Lymphocytes Relative: 30 % (ref 12.0–46.0)
Lymphs Abs: 1.5 10*3/uL (ref 0.7–4.0)
MCHC: 33.4 g/dL (ref 30.0–36.0)
MCV: 91.2 fl (ref 78.0–100.0)
Monocytes Absolute: 0.3 10*3/uL (ref 0.1–1.0)
Monocytes Relative: 6.8 % (ref 3.0–12.0)
Neutro Abs: 2.9 10*3/uL (ref 1.4–7.7)
Neutrophils Relative %: 59.3 % (ref 43.0–77.0)
Platelets: 255 10*3/uL (ref 150.0–400.0)
RBC: 4.91 Mil/uL (ref 3.87–5.11)
RDW: 14 % (ref 11.5–15.5)
WBC: 5 10*3/uL (ref 4.0–10.5)

## 2022-01-04 LAB — COMPREHENSIVE METABOLIC PANEL
ALT: 13 U/L (ref 0–35)
AST: 21 U/L (ref 0–37)
Albumin: 4.5 g/dL (ref 3.5–5.2)
Alkaline Phosphatase: 78 U/L (ref 39–117)
BUN: 8 mg/dL (ref 6–23)
CO2: 32 mEq/L (ref 19–32)
Calcium: 9.6 mg/dL (ref 8.4–10.5)
Chloride: 101 mEq/L (ref 96–112)
Creatinine, Ser: 0.78 mg/dL (ref 0.40–1.20)
GFR: 73.56 mL/min (ref >60.00)
Glucose, Bld: 99 mg/dL (ref 70–99)
Potassium: 4.5 mEq/L (ref 3.5–5.1)
Sodium: 139 mEq/L (ref 135–145)
Total Bilirubin: 0.6 mg/dL (ref 0.2–1.2)
Total Protein: 7.1 g/dL (ref 6.0–8.3)

## 2022-01-04 LAB — HEMOGLOBIN A1C: Hgb A1c MFr Bld: 5.9 % (ref 4.6–6.5)

## 2022-01-04 LAB — TSH: TSH: 1.56 u[IU]/mL (ref 0.35–5.50)

## 2022-01-04 LAB — LIPID PANEL
Cholesterol: 214 mg/dL — ABNORMAL HIGH (ref 0–200)
HDL: 51 mg/dL (ref >39.00)
LDL Cholesterol: 124 mg/dL — ABNORMAL HIGH (ref 0–99)
NonHDL: 162.67
Total CHOL/HDL Ratio: 4
Triglycerides: 191 mg/dL — ABNORMAL HIGH (ref 0.0–149.0)
VLDL: 38.2 mg/dL (ref 0.0–40.0)

## 2022-01-04 LAB — VITAMIN D 25 HYDROXY (VIT D DEFICIENCY, FRACTURES): VITD: 60.51 ng/mL (ref 30.00–100.00)

## 2022-01-04 MED ORDER — SYNTHROID 75 MCG PO TABS: ORAL_TABLET | ORAL | 3 refills | Status: DC

## 2022-01-04 MED ORDER — ALPRAZOLAM 0.25 MG PO TABS: ORAL_TABLET | ORAL | 2 refills | Status: DC

## 2022-01-04 NOTE — Patient Instructions (Addendum)
Blood work was ordered.   The lab is on the first floor.    Medications changes include :   None     Return in about 1 year (around 01/05/2023) for Physical Exam.   Health Maintenance, Female Adopting a healthy lifestyle and getting preventive care are important in promoting health and wellness. Ask your health care provider about: The right schedule for you to have regular tests and exams. Things you can do on your own to prevent diseases and keep yourself healthy. What should I know about diet, weight, and exercise? Eat a healthy diet  Eat a diet that includes plenty of vegetables, fruits, low-fat dairy products, and lean protein. Do not eat a lot of foods that are high in solid fats, added sugars, or sodium. Maintain a healthy weight Body mass index (BMI) is used to identify weight problems. It estimates body fat based on height and weight. Your health care provider can help determine your BMI and help you achieve or maintain a healthy weight. Get regular exercise Get regular exercise. This is one of the most important things you can do for your health. Most adults should: Exercise for at least 150 minutes each week. The exercise should increase your heart rate and make you sweat (moderate-intensity exercise). Do strengthening exercises at least twice a week. This is in addition to the moderate-intensity exercise. Spend less time sitting. Even light physical activity can be beneficial. Watch cholesterol and blood lipids Have your blood tested for lipids and cholesterol at 76 years of age, then have this test every 5 years. Have your cholesterol levels checked more often if: Your lipid or cholesterol levels are high. You are older than 76 years of age. You are at high risk for heart disease. What should I know about cancer screening? Depending on your health history and family history, you may need to have cancer screening at various ages. This may include screening  for: Breast cancer. Cervical cancer. Colorectal cancer. Skin cancer. Lung cancer. What should I know about heart disease, diabetes, and high blood pressure? Blood pressure and heart disease High blood pressure causes heart disease and increases the risk of stroke. This is more likely to develop in people who have high blood pressure readings or are overweight. Have your blood pressure checked: Every 3-5 years if you are 38-71 years of age. Every year if you are 39 years old or older. Diabetes Have regular diabetes screenings. This checks your fasting blood sugar level. Have the screening done: Once every three years after age 66 if you are at a normal weight and have a low risk for diabetes. More often and at a younger age if you are overweight or have a high risk for diabetes. What should I know about preventing infection? Hepatitis B If you have a higher risk for hepatitis B, you should be screened for this virus. Talk with your health care provider to find out if you are at risk for hepatitis B infection. Hepatitis C Testing is recommended for: Everyone born from [birth year] through 1965. Anyone with known risk factors for hepatitis C. Sexually transmitted infections (STIs) Get screened for STIs, including gonorrhea and chlamydia, if: You are sexually active and are younger than 76 years of age. You are older than 76 years of age and your health care provider tells you that you are at risk for this type of infection. Your sexual activity has changed since you were last screened, and you are at  increased risk for chlamydia or gonorrhea. Ask your health care provider if you are at risk. Ask your health care provider about whether you are at high risk for HIV. Your health care provider may recommend a prescription medicine to help prevent HIV infection. If you choose to take medicine to prevent HIV, you should first get tested for HIV. You should then be tested every 3 months for as long as you  are taking the medicine. Pregnancy If you are about to stop having your period (premenopausal) and you may become pregnant, seek counseling before you get pregnant. Take 400 to 800 micrograms (mcg) of folic acid every day if you become pregnant. Ask for birth control (contraception) if you want to prevent pregnancy. Osteoporosis and menopause Osteoporosis is a disease in which the bones lose minerals and strength with aging. This can result in bone fractures. If you are 62 years old or older, or if you are at risk for osteoporosis and fractures, ask your health care provider if you should: Be screened for bone loss. Take a calcium or vitamin D supplement to lower your risk of fractures. Be given hormone replacement therapy (HRT) to treat symptoms of menopause. Follow these instructions at home: Alcohol use Do not drink alcohol if: Your health care provider tells you not to drink. You are pregnant, may be pregnant, or are planning to become pregnant. If you drink alcohol: Limit how much you have to: 0-1 drink a day. Know how much alcohol is in your drink. In the [place] one drink equals one 12 oz bottle of beer (355 mL), one 5 oz glass of wine (148 mL), or one 1 oz glass of hard liquor (44 mL). Lifestyle Do not use any products that contain nicotine or tobacco. These products include cigarettes, chewing tobacco, and vaping devices, such as e-cigarettes. If you need help quitting, ask your health care provider. Do not use street drugs. Do not share needles. Ask your health care provider for help if you need support or information about quitting drugs. General instructions Schedule regular health, dental, and eye exams. Stay current with your vaccines. Tell your health care provider if: You often feel depressed. You have ever been abused or do not feel safe at home. Summary Adopting a healthy lifestyle and getting preventive care are important in promoting health and wellness. Follow your  health care provider's instructions about healthy diet, exercising, and getting tested or screened for diseases. Follow your health care provider's instructions on monitoring your cholesterol and blood pressure. This information is not intended to replace advice given to you by your health care provider. Make sure you discuss any questions you have with your health care provider. Document Revised: 05/31/2020 Document Reviewed: 05/31/2020 Elsevier Patient Education  [employer].

## 2022-01-04 NOTE — Assessment & Plan Note (Signed)
Chronic Regular exercise and healthy diet encouraged Check lipid panel  Continue pravastatin 40 mg daily 

## 2022-01-04 NOTE — Assessment & Plan Note (Signed)
Chronic Check a1c Low sugar / carb diet Stressed regular exercise  

## 2022-01-04 NOTE — Assessment & Plan Note (Signed)
Chronic Healthy diet, regular exercise encouraged Continue pravastatin 40 mg daily Check lipid panel

## 2022-01-04 NOTE — Assessment & Plan Note (Signed)
Chronic  Clinically euthyroid Check tsh and will titrate med dose if needed Currently taking Synthroid 75 mcg 6 days a week, 112.5 mcg daily 1 day a week

## 2022-01-04 NOTE — Assessment & Plan Note (Signed)
Chronic DEXA up-to-date Continue calcium and vitamin D daily Continue regular exercise-walking

## 2022-01-04 NOTE — Assessment & Plan Note (Signed)
Chronic Taking vitamin D daily Check vitamin D level  

## 2022-01-04 NOTE — Progress Notes (Addendum)
Subjective:    Patient ID: Amy Tucker, female    DOB: 16-Mar-1945, 76 y.o.   MRN: [id number]      HPI Amy Tucker is here for a Physical exam.   No concerns.    Medications and allergies reviewed with patient and updated if appropriate.  Current Outpatient Medications on File Prior to Visit  Medication Sig Dispense Refill   Cholecalciferol (VITAMIN D) 2000 units CAPS Take by mouth.     ELDERBERRY PO Take by mouth.     gabapentin (NEURONTIN) 100 MG capsule Take 1 capsule (100 mg total) by mouth at bedtime. 30 capsule 3   NON FORMULARY      OVER THE COUNTER MEDICATION CBD gummies     pravastatin (PRAVACHOL) 40 MG tablet Take 1 tablet by mouth once daily 90 tablet 0   Probiotic Product ([employer] ADV PROBIOTIC GUMMIES PO) Take by mouth daily.     No current facility-administered medications on file prior to visit.    Review of Systems  Constitutional:  Negative for fever.  Eyes:  Negative for visual disturbance.  Respiratory:  Negative for cough, shortness of breath and wheezing.   Cardiovascular:  Positive for palpitations (related to gabapentin only). Negative for chest pain and leg swelling.  Gastrointestinal:  Negative for abdominal pain, blood in stool, constipation, diarrhea and nausea.       Occ gerd  Genitourinary:  Negative for dysuria.  Musculoskeletal:  Positive for arthralgias (osteoarthritis) and back pain.  Skin:  Negative for rash.  Neurological:  Positive for headaches (intermittent). Negative for dizziness, light-headedness and numbness.  Psychiatric/Behavioral:  Negative for dysphoric mood. The patient is nervous/anxious (occasional).        Objective:   Vitals:   01/04/22 0749  BP: 124/72  Pulse: 74  Temp: 97.9 F (36.6 C)  SpO2: 99%   Filed Weights   01/04/22 0749  Weight: 140 lb (63.5 kg)   Body mass index is 23.3 kg/m.  BP Readings from Last 3 Encounters:  01/04/22 124/72  02/10/21 122/84  01/03/21 114/80    Wt Readings from Last 3  Encounters:  01/04/22 140 lb (63.5 kg)  10/10/21 134 lb (60.8 kg)  02/10/21 135 lb 6.4 oz (61.4 kg)       Physical Exam Constitutional: She appears well-developed and well-nourished. No distress.  HENT:  Head: Normocephalic and atraumatic.  Right Ear: External ear normal. Normal ear canal and TM Left Ear: External ear normal.  Normal ear canal and TM Mouth/Throat: Oropharynx is clear and moist.  Eyes: Conjunctivae normal.  Neck: Neck supple. No tracheal deviation present. No thyromegaly present.  No carotid bruit  Cardiovascular: Normal rate, regular rhythm and normal heart sounds.   1/6 sys murmur heard.  No edema. Pulmonary/Chest: Effort normal and breath sounds normal. No respiratory distress. She has no wheezes. She has no rales.  Breast: deferred   Abdominal: Soft. She exhibits no distension. There is no tenderness.  Lymphadenopathy: She has no cervical adenopathy.  Skin: Skin is warm and dry. She is not diaphoretic.  Psychiatric: She has a normal mood and affect. Her behavior is normal.     Lab Results  Component Value Date   WBC 5.6 01/03/2021   HGB 14.5 01/03/2021   HCT 44.9 01/03/2021   PLT 243.0 01/03/2021   GLUCOSE 95 01/03/2021   CHOL 212 (H) 01/03/2021   TRIG 153.0 (H) 01/03/2021   HDL 52.30 01/03/2021   LDLDIRECT 125.0 11/09/2014   LDLCALC 129 (H) 01/03/2021  ALT 12 01/03/2021   AST 18 01/03/2021   NA 138 01/03/2021   K 4.9 01/03/2021   CL 102 01/03/2021   CREATININE 0.75 01/03/2021   BUN 8 01/03/2021   CO2 29 01/03/2021   TSH 1.32 01/03/2021   HGBA1C 5.8 01/03/2021         Assessment & Plan:   Physical exam: Screening blood work  ordered Exercise  walking regularly Weight  normal Substance abuse  none   Reviewed recommended immunizations.   Health Maintenance  Topic Date Due   COVID-19 Vaccine (3 - 2023-24 season) 01/20/2022 (Originally 09/23/2021)   INFLUENZA VACCINE  04/23/2022 (Originally 08/23/2021)   Pneumonia Vaccine 65+ Years  old (1 - PCV) 01/05/2023 (Originally 03/01/2010)   Medicare Annual Wellness (AWV)  10/11/2022   DEXA SCAN  02/08/2023   DTaP/Tdap/Td (3 - Tdap) 06/22/2023   COLONOSCOPY (Pts 45-28yr Insurance coverage will need to be confirmed)  05/25/2025   Hepatitis C Screening  Completed   HPV VACCINES  Aged Out   Zoster Vaccines- Shingrix  Discontinued          See Problem List for Assessment and Plan of chronic medical problems.

## 2022-01-04 NOTE — Assessment & Plan Note (Signed)
Chronic intermittent lower back pain If standing long periods will get increased back pain and right leg pain Taking gabapentin 100 mg daily HS prn

## 2022-01-04 NOTE — Assessment & Plan Note (Signed)
Chronic Occasional anxiety Takes Xanax 0.125-0.25 mg daily as needed-continue

## 2022-01-13 ENCOUNTER — Other Ambulatory Visit: Payer: Self-pay | Admitting: Internal Medicine

## 2022-04-13 ENCOUNTER — Other Ambulatory Visit: Payer: Self-pay | Admitting: Internal Medicine

## 2022-07-11 ENCOUNTER — Other Ambulatory Visit: Payer: Self-pay | Admitting: Internal Medicine

## 2022-08-10 ENCOUNTER — Other Ambulatory Visit: Payer: Self-pay | Admitting: Internal Medicine

## 2022-08-10 DIAGNOSIS — Z1231 Encounter for screening mammogram for malignant neoplasm of breast: Secondary | ICD-10-CM

## 2022-08-18 ENCOUNTER — Ambulatory Visit: Admission: RE | Admit: 2022-08-18 | Payer: Medicare HMO | Source: Ambulatory Visit

## 2022-08-18 DIAGNOSIS — Z1231 Encounter for screening mammogram for malignant neoplasm of breast: Secondary | ICD-10-CM

## 2022-09-07 DIAGNOSIS — H26493 Other secondary cataract, bilateral: Secondary | ICD-10-CM | POA: Diagnosis not present

## 2022-09-07 DIAGNOSIS — H43393 Other vitreous opacities, bilateral: Secondary | ICD-10-CM | POA: Diagnosis not present

## 2022-09-21 DIAGNOSIS — H2513 Age-related nuclear cataract, bilateral: Secondary | ICD-10-CM | POA: Diagnosis not present

## 2022-09-24 HISTORY — PX: CATARACT EXTRACTION: SUR2

## 2022-10-06 DIAGNOSIS — H2513 Age-related nuclear cataract, bilateral: Secondary | ICD-10-CM | POA: Diagnosis not present

## 2022-10-06 DIAGNOSIS — H2511 Age-related nuclear cataract, right eye: Secondary | ICD-10-CM | POA: Diagnosis not present

## 2022-10-20 DIAGNOSIS — H2513 Age-related nuclear cataract, bilateral: Secondary | ICD-10-CM | POA: Diagnosis not present

## 2022-10-20 DIAGNOSIS — H2512 Age-related nuclear cataract, left eye: Secondary | ICD-10-CM | POA: Diagnosis not present

## 2022-11-15 DIAGNOSIS — Z0100 Encounter for examination of eyes and vision without abnormal findings: Secondary | ICD-10-CM | POA: Diagnosis not present

## 2022-12-18 ENCOUNTER — Ambulatory Visit (INDEPENDENT_AMBULATORY_CARE_PROVIDER_SITE_OTHER): Payer: Medicare HMO

## 2022-12-18 VITALS — Ht 65.0 in | Wt 137.0 lb

## 2022-12-18 DIAGNOSIS — Z0000 Encounter for general adult medical examination without abnormal findings: Secondary | ICD-10-CM

## 2022-12-18 NOTE — Progress Notes (Signed)
Subjective:   Amy Tucker is a 77 y.o. female who presents for Medicare Annual (Subsequent) preventive examination.  Visit Complete: Virtual I connected with  Phill Myron on 12/18/22 by a audio enabled telemedicine application and verified that I am speaking with the correct person using two identifiers.  Patient Location: Home  Provider Location: Office/Clinic  I discussed the limitations of evaluation and management by telemedicine. The patient expressed understanding and agreed to proceed.  Vital Signs: Because this visit was a virtual/telehealth visit, some criteria may be missing or patient reported. Any vitals not documented were not able to be obtained and vitals that have been documented are patient reported.   Cardiac Risk Factors include: advanced age (>67men, >69 women);dyslipidemia;Other (see comment), Risk factor comments: Aortic atherosclerosis, Ostoepenia     Objective:    Today's Vitals   12/18/22 1030  Weight: 137 lb (62.1 kg)  Height: 5\' 5"  (1.651 m)   Body mass index is 22.8 kg/m.     12/18/2022   10:39 AM 10/10/2021    1:05 PM 04/12/2020    1:20 PM 02/15/2016    1:12 PM 02/04/2016    1:30 PM  Advanced Directives  Does Patient Have a Medical Advance Directive? No Yes Yes No No  Type of Special educational needs teacher of [place];Living will Living will;Healthcare Power of Attorney    Copy of Healthcare Power of Attorney in Chart?  No - copy requested No - copy requested      Current Medications (verified) Outpatient Encounter Medications as of 12/18/2022  Medication Sig   ALPRAZolam (XANAX) 0.25 MG tablet TAKE 1/2 (ONE-HALF) TABLET BY MOUTH EVERY 8 HOURS AS NEEDED   Cholecalciferol (VITAMIN D) 2000 units CAPS Take by mouth.   ELDERBERRY PO Take by mouth.   gabapentin (NEURONTIN) 100 MG capsule Take 1 capsule (100 mg total) by mouth at bedtime.   NON FORMULARY    OVER THE COUNTER MEDICATION CBD gummies   pravastatin (PRAVACHOL) 40 MG tablet  Take 1 tablet (40 mg total) by mouth daily. Annual appt due in Dec must see provider for future refills   Probiotic Product ([employer] ADV PROBIOTIC GUMMIES PO) Take by mouth daily.   SYNTHROID 75 MCG tablet TAKE 1 TABLET BY MOUTH ONCE DAILY EXCEPT ON WEDNESDAY, TAKE 1 AND 1/2 TABLET ON WEDNESDAY   No facility-administered encounter medications on file as of 12/18/2022.    Allergies (verified) Ciprofloxacin, Meloxicam, Tramadol hcl, and Zocor [simvastatin]   History: Past Medical History:  Diagnosis Date   Anxiety    GERD (gastroesophageal reflux disease)    Heart murmur    Osteopenia    BMD as per Dr Richarda Overlie, Gyn   Other abnormal glucose    elevated FBS   Other and unspecified hyperlipidemia    NMR lipoprofile 2004: LDL 169 (2322/1419), TG 130, LDL goal =<100   Unspecified hypothyroidism    Past Surgical History:  Procedure Laterality Date   CATARACT EXTRACTION Bilateral 09/2022   COLONOSCOPY  02/15/2016   Nandigam   DILATION AND CURETTAGE OF UTERUS  1987   EXTERNAL FIXATION ANKLE FRACTURE  1997   post fall (no surgery)   TONSILLECTOMY AND ADENOIDECTOMY  1953   Family History  Problem Relation Age of Onset   Thyroid disease Maternal Aunt        goiter   Diabetes Other        32 M uncles & M aunts   Leukemia Father    Diabetes Mother  Hypertension Mother    Coronary artery disease Mother    Heart attack Mother 15   Stroke Mother        > 68   Rheum arthritis Mother    Kidney failure Mother    Hypertension Brother    Other Paternal Uncle    Alzheimer's disease Paternal Aunt    Cirrhosis Paternal Uncle    Heart attack Maternal Uncle 52   Colon cancer Neg Hx    Esophageal cancer Neg Hx    Rectal cancer Neg Hx    Stomach cancer Neg Hx    Colon polyps Neg Hx    Social History   Socioeconomic History   Marital status: Married    Spouse name: Dorinda Hill   Number of children: 1   Years of education: Not on file   Highest education level: Not on file   Occupational History   Occupation: Tree surgeon: [employer]  Tobacco Use   Smoking status: Former    Current packs/day: 0.00    Average packs/day: 2.5 packs/day for 20.0 years (50.0 ttl pk-yrs)    Types: Cigarettes    Start date: 01/23/1970    Quit date: 01/23/1990    Years since quitting: 32.9   Smokeless tobacco: Never   Tobacco comments:    pt smoked from age [age]  (40- 1992),up to 2 ppd  Vaping Use   Vaping status: Never Used  Substance and Sexual Activity   Alcohol use: Not Currently    Comment: Rarely   Drug use: No   Sexual activity: Not on file  Other Topics Concern   Not on file  Social History Narrative   Regular exercise- walks dog 2/day - total 45 min   Low fat, carb   Lives with husband   Social Determinants of Health   Financial Resource Strain: Low Risk  (10/10/2021)   Overall Financial Resource Strain (CARDIA)    Difficulty of Paying Living Expenses: Not hard at all  Food Insecurity: No Food Insecurity (10/10/2021)   Hunger Vital Sign    Worried About Running Out of Food in the Last Year: Never true    Ran Out of Food in the Last Year: Never true  Transportation Needs: No Transportation Needs (10/10/2021)   PRAPARE - Administrator, Civil Service (Medical): No    Lack of Transportation (Non-Medical): No  Physical Activity: Sufficiently Active (10/10/2021)   Exercise Vital Sign    Days of Exercise per Week: 5 days    Minutes of Exercise per Session: 30 min  Stress: No Stress Concern Present (10/10/2021)   [employer] of Occupational Health - Occupational Stress Questionnaire    Feeling of Stress : Only a little  Social Connections: Moderately Isolated (10/10/2021)   Social Connection and Isolation Panel [NHANES]    Frequency of Communication with Friends and Family: More than three times a week    Frequency of Social Gatherings with Friends and Family: More than three times a week    Attends Religious Services:  Never    Database administrator or Organizations: No    Attends Engineer, structural: Never    Marital Status: Married    Tobacco Counseling Counseling given: Not Answered Tobacco comments: pt smoked from age [age]  (1973- 1992),up to 2 ppd   Clinical Intake:  Pre-visit preparation completed: Yes  Pain : No/denies pain     BMI - recorded: 22.8 Nutritional Status: BMI of 19-24  Normal  Nutritional Risks: None (ocassional nausea) Diabetes: No  How often do you need to have someone help you when you read instructions, pamphlets, or other written materials from your doctor or pharmacy?: 1 - Never  Interpreter Needed?: No  Information entered by :: Lynzy Rawles, RMA   Activities of Daily Living    12/18/2022   10:30 AM  In your present state of health, do you have any difficulty performing the following activities:  Hearing? 1  Comment pt stated she can not hear at all  Vision? 0  Difficulty concentrating or making decisions? 0  Walking or climbing stairs? 0  Dressing or bathing? 0  Doing errands, shopping? 0  Preparing Food and eating ? N  Using the Toilet? N  In the past six months, have you accidently leaked urine? Y  Comment occassionally  Do you have problems with loss of bowel control? N  Managing your Medications? N  Managing your Finances? N  Housekeeping or managing your Housekeeping? N    Patient Care Team: Pincus Sanes, MD as PCP - General (Internal Medicine)  Indicate any recent Medical Services you may have received from other than [facility] providers in the past year (date may be approximate).     Assessment:   This is a routine wellness examination for [place].  Hearing/Vision screen Hearing Screening - Comments:: Has issues but does not wear hearing aides Vision Screening - Comments:: Wears eyeglasses   Goals Addressed             This Visit's Progress    Patient Stated   On track    To maintain my current health status by  continuing to eat healthy and stay physically active.      Depression Screen    01/04/2022    7:53 AM 10/10/2021    1:04 PM 04/12/2020    1:38 PM 12/30/2018    7:55 AM 12/25/2016    2:16 PM 05/22/2015    8:17 AM 10/24/2012    8:23 AM  PHQ 2/9 Scores  PHQ - 2 Score 0 0 1 0 0 0 0  PHQ- 9 Score 1          Fall Risk    12/18/2022   10:40 AM 01/04/2022    7:53 AM 10/10/2021    1:02 PM 04/12/2020    1:21 PM 12/31/2019    8:08 AM  Fall Risk   Falls in the past year? 0 0 0 0 0  Number falls in past yr: 0 0 0 0 0  Injury with Fall? 0 0 0 0 0  Risk for fall due to : No Fall Risks No Fall Risks No Fall Risks No Fall Risks No Fall Risks  Follow up Falls prevention discussed;Falls evaluation completed Falls evaluation completed Falls prevention discussed Falls evaluation completed Falls evaluation completed    MEDICARE RISK AT HOME: Medicare Risk at Home Any stairs in or around the home?: Yes If so, are there any without handrails?: Yes (has a chair lift) Home free of loose throw rugs in walkways, pet beds, electrical cords, etc?: Yes Adequate lighting in your home to reduce risk of falls?: Yes Life alert?: No Use of a cane, walker or w/c?: No Grab bars in the bathroom?: Yes Shower chair or bench in shower?: Yes Elevated toilet seat or a handicapped toilet?: Yes  TIMED UP AND GO:  Was the test performed?  No    Cognitive Function:        10/10/2021  1:05 PM  6CIT Screen  What Year? 0 points  What month? 0 points  What time? 0 points  Count back from 20 0 points  Months in reverse 0 points  Repeat phrase 0 points  Total Score 0 points    Immunizations Immunization History  Administered Date(s) Administered   PFIZER(Purple Top)SARS-COV-2 Vaccination 09/12/2019, 10/08/2019   Td 01/23/2005, 06/21/2013    TDAP status: Up to date  Flu Vaccine status: Declined, Education has been provided regarding the importance of this vaccine but patient still declined. Advised may  receive this vaccine at local pharmacy or Health Dept. Aware to provide a copy of the vaccination record if obtained from local pharmacy or Health Dept. Verbalized acceptance and understanding.  Pneumococcal vaccine status: Declined,  Education has been provided regarding the importance of this vaccine but patient still declined. Advised may receive this vaccine at local pharmacy or Health Dept. Aware to provide a copy of the vaccination record if obtained from local pharmacy or Health Dept. Verbalized acceptance and understanding.   Covid-19 vaccine status: Declined, Education has been provided regarding the importance of this vaccine but patient still declined. Advised may receive this vaccine at local pharmacy or Health Dept.or vaccine clinic. Aware to provide a copy of the vaccination record if obtained from local pharmacy or Health Dept. Verbalized acceptance and understanding.  Qualifies for Shingles Vaccine? Yes   Zostavax completed  Declined   Shingrix Completed?: No.    Education has been provided regarding the importance of this vaccine. Patient has been advised to call insurance company to determine out of pocket expense if they have not yet received this vaccine. Advised may also receive vaccine at local pharmacy or Health Dept. Verbalized acceptance and understanding.  Screening Tests Health Maintenance  Topic Date Due   INFLUENZA VACCINE  Never done   COVID-19 Vaccine (3 - 2023-24 season) 09/24/2022   Pneumonia Vaccine 50+ Years old (1 of 1 - PCV) 01/05/2023 (Originally 03/01/2010)   DEXA SCAN  02/08/2023   DTaP/Tdap/Td (3 - Tdap) 06/22/2023   Medicare Annual Wellness (AWV)  12/18/2023   Colonoscopy  05/25/2025   Hepatitis C Screening  Completed   HPV VACCINES  Aged Out   Zoster Vaccines- Shingrix  Discontinued    Health Maintenance  Health Maintenance Due  Topic Date Due   INFLUENZA VACCINE  Never done   COVID-19 Vaccine (3 - 2023-24 season) 09/24/2022    Colorectal  cancer screening: Type of screening: Colonoscopy. Completed 05/25/2020. Repeat every 5 years  Mammogram status: Completed 08/18/2022. Repeat every year  Bone Density status: Completed 02/07/2021. Results reflect: Bone density results: OSTEOPENIA. Repeat every 2 years.  Lung Cancer Screening: (Low Dose CT Chest recommended if Age 10-80 years, 20 pack-year currently smoking OR have quit w/in 15years.) does not qualify.   Lung Cancer Screening Referral: N/A  Additional Screening:  Hepatitis C Screening: does qualify; Completed 11/10/2015  Vision Screening: Recommended annual ophthalmology exams for early detection of glaucoma and other disorders of the eye. Is the patient up to date with their annual eye exam?  Yes  Who is the provider or what is the name of the office in which the patient attends annual eye exams? [facility] If pt is not established with a provider, would they like to be referred to a provider to establish care? No .   Dental Screening: Recommended annual dental exams for proper oral hygiene  Community Resource Referral / Chronic Care Management: CRR required this visit?  No  CCM required this visit?  No     Plan:     I have personally reviewed and noted the following in the patient's chart:   Medical and social history Use of alcohol, tobacco or illicit drugs  Current medications and supplements including opioid prescriptions. Patient is not currently taking opioid prescriptions. Functional ability and status Nutritional status Physical activity Advanced directives List of other physicians Hospitalizations, surgeries, and ER visits in previous 12 months Vitals Screenings to include cognitive, depression, and falls Referrals and appointments  In addition, I have reviewed and discussed with patient certain preventive protocols, quality metrics, and best practice recommendations. A written personalized care plan for preventive services as well as general  preventive health recommendations were provided to patient.     Tynika Luddy L Wenona [place], CMA   12/18/2022   After Visit Summary: (MyChart) Due to this being a telephonic visit, the after visit summary with patients personalized plan was offered to patient via MyChart   Nurse Notes: Patient decline all vaccines.  She is up to date on all of her yearly screenings.  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  She also stated that her dog has cancer and is not doing well.  Patient is dealing with a lot and I wonder if she can use someone to talk to help maybe get her through the holidays. She has an appointment coming up with Dr. Lawerance Bach in December, maybe Dr. Lawerance Bach can discuss these concerns during patient's visit.

## 2022-12-18 NOTE — Patient Instructions (Signed)
Amy Tucker , Thank you for taking time to come for your Medicare Wellness Visit. I appreciate your ongoing commitment to your health goals. Please review the following plan we discussed and let me know if I can assist you in the future.   Referrals/Orders/Follow-Ups/Clinician Recommendations: It was nice talking with you today.  Keep up the good work.  This is a list of the screening recommended for you and due dates:  Health Maintenance  Topic Date Due   COVID-19 Vaccine (3 - 2023-24 season) 01/03/2023*   Pneumonia Vaccine (1 of 1 - PCV) 01/05/2023*   Flu Shot  04/23/2023*   DEXA scan (bone density measurement)  02/08/2023   DTaP/Tdap/Td vaccine (3 - Tdap) 06/22/2023   Medicare Annual Wellness Visit  12/18/2023   Colon Cancer Screening  05/25/2025   Hepatitis C Screening  Completed   HPV Vaccine  Aged Out   Zoster (Shingles) Vaccine  Discontinued  *Topic was postponed. The date shown is not the original due date.    Advanced directives: (Copy Requested) Please bring a copy of your health care power of attorney and living will to the office to be added to your chart at your convenience.  Next Medicare Annual Wellness Visit scheduled for next year: Yes

## 2023-01-07 ENCOUNTER — Encounter: Payer: Self-pay | Admitting: Internal Medicine

## 2023-01-07 NOTE — Progress Notes (Unsigned)
Subjective:    Patient ID: Amy Tucker, female    DOB: Oct 04, 1945, 77 y.o.   MRN: [id number]      HPI Amy Tucker is here for a Physical exam and her chronic medical problems.    Over the past year she has had a lot of increased stress-her husband was sick and then her dog had cancer.  She just recently had her dog down.  She is depressed and has had increased anxiety.  She is taking the alprazolam almost daily and is not working as well.  Her brother is on clonazepam and she wondered about that.  Medications and allergies reviewed with patient and updated if appropriate.  Current Outpatient Medications on File Prior to Visit  Medication Sig Dispense Refill   ALPRAZolam (XANAX) 0.25 MG tablet TAKE 1/2 (ONE-HALF) TABLET BY MOUTH EVERY 8 HOURS AS NEEDED 60 tablet 2   Cholecalciferol (VITAMIN D) 2000 units CAPS Take by mouth.     ELDERBERRY PO Take by mouth.     gabapentin (NEURONTIN) 100 MG capsule Take 1 capsule (100 mg total) by mouth at bedtime. 30 capsule 3   NON FORMULARY      OVER THE COUNTER MEDICATION CBD gummies     Probiotic Product (CVS ADV PROBIOTIC GUMMIES PO) Take by mouth daily.     No current facility-administered medications on file prior to visit.    Review of Systems  Constitutional:  Negative for fever.  Eyes:  Negative for visual disturbance.  Respiratory:  Negative for cough, shortness of breath and wheezing.   Cardiovascular:  Positive for palpitations (occ). Negative for chest pain and leg swelling.  Gastrointestinal:  Negative for abdominal pain, blood in stool, constipation and diarrhea.       Frequent gerd - takes prilosec prn  Genitourinary:  Negative for dysuria.  Musculoskeletal:  Negative for arthralgias and back pain.  Skin:  Negative for rash.  Neurological:  Negative for light-headedness and headaches.  Psychiatric/Behavioral:  Negative for dysphoric mood. The patient is nervous/anxious.        Objective:   Vitals:   01/08/23 0746  BP:  126/74  Pulse: 70  Temp: 97.9 F (36.6 C)  SpO2: 99%   Filed Weights   01/08/23 0746  Weight: 138 lb (62.6 kg)   Body mass index is 22.96 kg/m.  BP Readings from Last 3 Encounters:  01/08/23 126/74  01/04/22 124/72  02/10/21 122/84    Wt Readings from Last 3 Encounters:  01/08/23 138 lb (62.6 kg)  12/18/22 137 lb (62.1 kg)  01/04/22 140 lb (63.5 kg)       Physical Exam Constitutional: She appears well-developed and well-nourished. No distress.  HENT:  Head: Normocephalic and atraumatic.  Right Ear: External ear normal. Normal ear canal and TM Left Ear: External ear normal.  Normal ear canal and TM Mouth/Throat: Oropharynx is clear and moist.  Eyes: Conjunctivae normal.  Neck: Neck supple. No tracheal deviation present. No thyromegaly present.  No carotid bruit  Cardiovascular: Normal rate, regular rhythm and normal heart sounds.   No murmur heard.  No edema. Pulmonary/Chest: Effort normal and breath sounds normal. No respiratory distress. She has no wheezes. She has no rales.  Breast: deferred   Abdominal: Soft. She exhibits no distension. There is no tenderness.  Lymphadenopathy: She has no cervical adenopathy.  Skin: Skin is warm and dry. She is not diaphoretic.  Psychiatric: She has a normal mood and affect. Her behavior is normal.  Lab Results  Component Value Date   WBC 5.0 01/04/2022   HGB 15.0 01/04/2022   HCT 44.8 01/04/2022   PLT 255.0 01/04/2022   GLUCOSE 99 01/04/2022   CHOL 214 (H) 01/04/2022   TRIG 191.0 (H) 01/04/2022   HDL 51.00 01/04/2022   LDLDIRECT 125.0 11/09/2014   LDLCALC 124 (H) 01/04/2022   ALT 13 01/04/2022   AST 21 01/04/2022   NA 139 01/04/2022   K 4.5 01/04/2022   CL 101 01/04/2022   CREATININE 0.78 01/04/2022   BUN 8 01/04/2022   CO2 32 01/04/2022   TSH 1.56 01/04/2022   HGBA1C 5.9 01/04/2022    The 10-year ASCVD risk score (Arnett DK, et al., 2019) is: 19.1%   Values used to calculate the score:     Age: 31  years     Sex: Female     Is Non-Hispanic African American: No     Diabetic: No     Tobacco smoker: No     Systolic Blood Pressure: 126 mmHg     Is BP treated: No     HDL Cholesterol: 51 mg/dL     Total Cholesterol: 214 mg/dL      Assessment & Plan:   Physical exam: Screening blood work  ordered Exercise  regular - walking her dog Weight   normal Substance abuse  none   Reviewed recommended immunizations.   Health Maintenance  Topic Date Due   COVID-19 Vaccine (3 - 2024-25 season) 01/24/2023 (Originally 09/24/2022)   INFLUENZA VACCINE  04/23/2023 (Originally 08/24/2022)   Pneumonia Vaccine 70+ Years old (1 of 1 - PCV) 01/08/2024 (Originally 03/01/2010)   DEXA SCAN  02/08/2023   DTaP/Tdap/Td (3 - Tdap) 06/22/2023   Medicare Annual Wellness (AWV)  12/18/2023   Colonoscopy  05/25/2025   Hepatitis C Screening  Completed   HPV VACCINES  Aged Out   Zoster Vaccines- Shingrix  Discontinued          See Problem List for Assessment and Plan of chronic medical problems.

## 2023-01-07 NOTE — Patient Instructions (Addendum)
Blood work was ordered.       Medications changes include :   sertraline 25 mg daily     Return in about 1 year (around 01/08/2024) for Physical Exam.    Health Maintenance, Female Adopting a healthy lifestyle and getting preventive care are important in promoting health and wellness. Ask your health care provider about: The right schedule for you to have regular tests and exams. Things you can do on your own to prevent diseases and keep yourself healthy. What should I know about diet, weight, and exercise? Eat a healthy diet  Eat a diet that includes plenty of vegetables, fruits, low-fat dairy products, and lean protein. Do not eat a lot of foods that are high in solid fats, added sugars, or sodium. Maintain a healthy weight Body mass index (BMI) is used to identify weight problems. It estimates body fat based on height and weight. Your health care provider can help determine your BMI and help you achieve or maintain a healthy weight. Get regular exercise Get regular exercise. This is one of the most important things you can do for your health. Most adults should: Exercise for at least 150 minutes each week. The exercise should increase your heart rate and make you sweat (moderate-intensity exercise). Do strengthening exercises at least twice a week. This is in addition to the moderate-intensity exercise. Spend less time sitting. Even light physical activity can be beneficial. Watch cholesterol and blood lipids Have your blood tested for lipids and cholesterol at 77 years of age, then have this test every 5 years. Have your cholesterol levels checked more often if: Your lipid or cholesterol levels are high. You are older than 77 years of age. You are at high risk for heart disease. What should I know about cancer screening? Depending on your health history and family history, you may need to have cancer screening at various ages. This may include screening for: Breast  cancer. Cervical cancer. Colorectal cancer. Skin cancer. Lung cancer. What should I know about heart disease, diabetes, and high blood pressure? Blood pressure and heart disease High blood pressure causes heart disease and increases the risk of stroke. This is more likely to develop in people who have high blood pressure readings or are overweight. Have your blood pressure checked: Every 3-5 years if you are 38-64 years of age. Every year if you are 66 years old or older. Diabetes Have regular diabetes screenings. This checks your fasting blood sugar level. Have the screening done: Once every three years after age 18 if you are at a normal weight and have a low risk for diabetes. More often and at a younger age if you are overweight or have a high risk for diabetes. What should I know about preventing infection? Hepatitis B If you have a higher risk for hepatitis B, you should be screened for this virus. Talk with your health care provider to find out if you are at risk for hepatitis B infection. Hepatitis C Testing is recommended for: Everyone born from [birth year] through 1965. Anyone with known risk factors for hepatitis C. Sexually transmitted infections (STIs) Get screened for STIs, including gonorrhea and chlamydia, if: You are sexually active and are younger than 77 years of age. You are older than 77 years of age and your health care provider tells you that you are at risk for this type of infection. Your sexual activity has changed since you were last screened, and you are at increased risk  for chlamydia or gonorrhea. Ask your health care provider if you are at risk. Ask your health care provider about whether you are at high risk for HIV. Your health care provider may recommend a prescription medicine to help prevent HIV infection. If you choose to take medicine to prevent HIV, you should first get tested for HIV. You should then be tested every 3 months for as long as you are taking  the medicine. Pregnancy If you are about to stop having your period (premenopausal) and you may become pregnant, seek counseling before you get pregnant. Take 400 to 800 micrograms (mcg) of folic acid every day if you become pregnant. Ask for birth control (contraception) if you want to prevent pregnancy. Osteoporosis and menopause Osteoporosis is a disease in which the bones lose minerals and strength with aging. This can result in bone fractures. If you are 41 years old or older, or if you are at risk for osteoporosis and fractures, ask your health care provider if you should: Be screened for bone loss. Take a calcium or vitamin D supplement to lower your risk of fractures. Be given hormone replacement therapy (HRT) to treat symptoms of menopause. Follow these instructions at home: Alcohol use Do not drink alcohol if: Your health care provider tells you not to drink. You are pregnant, may be pregnant, or are planning to become pregnant. If you drink alcohol: Limit how much you have to: 0-1 drink a day. Know how much alcohol is in your drink. In the [place] one drink equals one 12 oz bottle of beer (355 mL), one 5 oz glass of wine (148 mL), or one 1 oz glass of hard liquor (44 mL). Lifestyle Do not use any products that contain nicotine or tobacco. These products include cigarettes, chewing tobacco, and vaping devices, such as e-cigarettes. If you need help quitting, ask your health care provider. Do not use street drugs. Do not share needles. Ask your health care provider for help if you need support or information about quitting drugs. General instructions Schedule regular health, dental, and eye exams. Stay current with your vaccines. Tell your health care provider if: You often feel depressed. You have ever been abused or do not feel safe at home. Summary Adopting a healthy lifestyle and getting preventive care are important in promoting health and wellness. Follow your health  care provider's instructions about healthy diet, exercising, and getting tested or screened for diseases. Follow your health care provider's instructions on monitoring your cholesterol and blood pressure. This information is not intended to replace advice given to you by your health care provider. Make sure you discuss any questions you have with your health care provider. Document Revised: 05/31/2020 Document Reviewed: 05/31/2020 Elsevier Patient Education  2024 [employer].

## 2023-01-08 ENCOUNTER — Ambulatory Visit (INDEPENDENT_AMBULATORY_CARE_PROVIDER_SITE_OTHER): Payer: Medicare HMO | Admitting: Internal Medicine

## 2023-01-08 VITALS — BP 126/74 | HR 70 | Temp 97.9°F | Ht 65.0 in | Wt 138.0 lb

## 2023-01-08 DIAGNOSIS — M85851 Other specified disorders of bone density and structure, right thigh: Secondary | ICD-10-CM

## 2023-01-08 DIAGNOSIS — E039 Hypothyroidism, unspecified: Secondary | ICD-10-CM | POA: Diagnosis not present

## 2023-01-08 DIAGNOSIS — I700 Atherosclerosis of aorta: Secondary | ICD-10-CM

## 2023-01-08 DIAGNOSIS — F32A Depression, unspecified: Secondary | ICD-10-CM | POA: Insufficient documentation

## 2023-01-08 DIAGNOSIS — R7303 Prediabetes: Secondary | ICD-10-CM | POA: Diagnosis not present

## 2023-01-08 DIAGNOSIS — E559 Vitamin D deficiency, unspecified: Secondary | ICD-10-CM | POA: Diagnosis not present

## 2023-01-08 DIAGNOSIS — F3289 Other specified depressive episodes: Secondary | ICD-10-CM

## 2023-01-08 DIAGNOSIS — Z0000 Encounter for general adult medical examination without abnormal findings: Secondary | ICD-10-CM | POA: Diagnosis not present

## 2023-01-08 DIAGNOSIS — E7849 Other hyperlipidemia: Secondary | ICD-10-CM

## 2023-01-08 DIAGNOSIS — F419 Anxiety disorder, unspecified: Secondary | ICD-10-CM

## 2023-01-08 LAB — CBC WITH DIFFERENTIAL/PLATELET
Basophils Absolute: 0 10*3/uL (ref 0.0–0.1)
Basophils Relative: 0.7 % (ref 0.0–3.0)
Eosinophils Absolute: 0.1 10*3/uL (ref 0.0–0.7)
Eosinophils Relative: 2.6 % (ref 0.0–5.0)
HCT: 43.1 % (ref 36.0–46.0)
Hemoglobin: 14.5 g/dL (ref 12.0–15.0)
Lymphocytes Relative: 30.1 % (ref 12.0–46.0)
Lymphs Abs: 1.3 10*3/uL (ref 0.7–4.0)
MCHC: 33.7 g/dL (ref 30.0–36.0)
MCV: 92.6 fL (ref 78.0–100.0)
Monocytes Absolute: 0.3 10*3/uL (ref 0.1–1.0)
Monocytes Relative: 6.8 % (ref 3.0–12.0)
Neutro Abs: 2.5 10*3/uL (ref 1.4–7.7)
Neutrophils Relative %: 59.8 % (ref 43.0–77.0)
Platelets: 247 10*3/uL (ref 150.0–400.0)
RBC: 4.65 Mil/uL (ref 3.87–5.11)
RDW: 13.3 % (ref 11.5–15.5)
WBC: 4.2 10*3/uL (ref 4.0–10.5)

## 2023-01-08 LAB — HEMOGLOBIN A1C: Hgb A1c MFr Bld: 5.9 % (ref 4.6–6.5)

## 2023-01-08 LAB — COMPREHENSIVE METABOLIC PANEL
ALT: 12 U/L (ref 0–35)
AST: 16 U/L (ref 0–37)
Albumin: 4.2 g/dL (ref 3.5–5.2)
Alkaline Phosphatase: 75 U/L (ref 39–117)
BUN: 7 mg/dL (ref 6–23)
CO2: 30 meq/L (ref 19–32)
Calcium: 9.2 mg/dL (ref 8.4–10.5)
Chloride: 103 meq/L (ref 96–112)
Creatinine, Ser: 0.77 mg/dL (ref 0.40–1.20)
GFR: 74.18 mL/min (ref >60.00)
Glucose, Bld: 89 mg/dL (ref 70–99)
Potassium: 4.4 meq/L (ref 3.5–5.1)
Sodium: 140 meq/L (ref 135–145)
Total Bilirubin: 0.5 mg/dL (ref 0.2–1.2)
Total Protein: 6.7 g/dL (ref 6.0–8.3)

## 2023-01-08 LAB — LIPID PANEL
Cholesterol: 186 mg/dL (ref 0–200)
HDL: 45.2 mg/dL (ref >39.00)
LDL Cholesterol: 107 mg/dL — ABNORMAL HIGH (ref 0–99)
NonHDL: 141.25
Total CHOL/HDL Ratio: 4
Triglycerides: 170 mg/dL — ABNORMAL HIGH (ref 0.0–149.0)
VLDL: 34 mg/dL (ref 0.0–40.0)

## 2023-01-08 LAB — VITAMIN D 25 HYDROXY (VIT D DEFICIENCY, FRACTURES): VITD: 67.97 ng/mL (ref 30.00–100.00)

## 2023-01-08 LAB — TSH: TSH: 0.31 u[IU]/mL — ABNORMAL LOW (ref 0.35–5.50)

## 2023-01-08 MED ORDER — SYNTHROID 75 MCG PO TABS: ORAL_TABLET | ORAL | 3 refills | Status: DC

## 2023-01-08 MED ORDER — ALPRAZOLAM 0.25 MG PO TABS: ORAL_TABLET | ORAL | 2 refills | Status: DC

## 2023-01-08 MED ORDER — SERTRALINE HCL 25 MG PO TABS: 25.0000 mg | ORAL_TABLET | Freq: Every day | ORAL | 5 refills | Status: DC

## 2023-01-08 MED ORDER — PRAVASTATIN SODIUM 40 MG PO TABS: 40.0000 mg | ORAL_TABLET | Freq: Every day | ORAL | 3 refills | Status: DC

## 2023-01-08 NOTE — Assessment & Plan Note (Signed)
Chronic Lab Results  Component Value Date   HGBA1C 5.9 01/04/2022   Check a1c Low sugar / carb diet Stressed regular exercise

## 2023-01-08 NOTE — Assessment & Plan Note (Signed)
Chronic DEXA due - ordered Continue calcium and vitamin D daily Continue regular exercise-walking Check vitamin d level

## 2023-01-08 NOTE — Assessment & Plan Note (Addendum)
Chronic Increased anxiety - taking xanax almost daily - still not controlled Wondered about clonazepam  - does not want to be on a medication that causes weight gain Also having some depression Agrees to try sertraline 25 mg daily-discussed we can adjust the dose if needed Continue Xanax 0.25 mg daily as needed

## 2023-01-08 NOTE — Assessment & Plan Note (Signed)
Chronic Taking vitamin D daily Check vitamin D level  

## 2023-01-08 NOTE — Assessment & Plan Note (Signed)
  new She has not had a tough year-has been has had many medical problems and her dog was diagnosed with cancer and she just recently had to put her dog down Having increased anxiety and some depression Start sertraline 25 mg daily Discussed that we can increase this if needed

## 2023-01-08 NOTE — Assessment & Plan Note (Signed)
Chronic Regular exercise and healthy diet encouraged Check lipid panel, cmp, tsh, cbc Continue pravastatin 40 mg daily

## 2023-01-08 NOTE — Assessment & Plan Note (Signed)
Chronic  Clinically euthyroid Check tsh and will titrate med dose if needed Currently taking Synthroid 75 mcg 6 days a week, 112.5 mcg daily 1 day a week

## 2023-01-08 NOTE — Assessment & Plan Note (Signed)
Chronic Healthy diet, regular exercise encouraged Continue pravastatin 40 mg daily Check lipid panel

## 2023-01-09 ENCOUNTER — Encounter: Payer: Self-pay | Admitting: Internal Medicine

## 2023-01-09 ENCOUNTER — Other Ambulatory Visit: Payer: Self-pay | Admitting: Internal Medicine

## 2023-01-09 MED ORDER — SYNTHROID 75 MCG PO TABS: 75.0000 ug | ORAL_TABLET | Freq: Every day | ORAL | 3 refills | Status: DC

## 2023-02-05 ENCOUNTER — Other Ambulatory Visit: Payer: Medicare HMO

## 2023-02-14 ENCOUNTER — Inpatient Hospital Stay: Admission: RE | Admit: 2023-02-14 | Payer: Medicare HMO | Source: Ambulatory Visit

## 2023-02-21 ENCOUNTER — Ambulatory Visit (INDEPENDENT_AMBULATORY_CARE_PROVIDER_SITE_OTHER)
Admission: RE | Admit: 2023-02-21 | Discharge: 2023-02-21 | Disposition: A | Discharge status: Home / Self Care | Payer: Medicare HMO | Source: Ambulatory Visit | Attending: Internal Medicine

## 2023-02-21 DIAGNOSIS — M85851 Other specified disorders of bone density and structure, right thigh: Secondary | ICD-10-CM | POA: Diagnosis not present

## 2023-02-22 ENCOUNTER — Encounter: Payer: Self-pay | Admitting: Internal Medicine

## 2023-05-24 NOTE — Progress Notes (Signed)
 Please   Subjective:    Patient ID: Amy Tucker, female    DOB: Jun 14, 1945, 78 y.o.   MRN: [id number]      HPI Amy Tucker is here for  Chief Complaint  Patient presents with   Hip Pain    Left hip and upper thigh pain (pain also noted in groin)     Left hip, groin and posterior leg pain -the pain started January pain.  Has pain left groin and wraps about hip to buttock and down posterior upper leg.  She denies any activity or injury that may have caused it.  She has difficulty walking up/down stairs - has to use one leg at a time.  Any pressure on the leg and walking hurts.    No lower back pain.  One one point she did have left buttock pain.  Some tingling.  No numbness.  Leg feels weak.    She thinks she may have some intermittent pain in the left groin over the past 3 years, but nothing persistent or significant.   Medications and allergies reviewed with patient and updated if appropriate.  Current Outpatient Medications on File Prior to Visit  Medication Sig Dispense Refill   ALPRAZolam  (XANAX ) 0.25 MG tablet TAKE 1/2 (ONE-HALF) TABLET BY MOUTH EVERY 8 HOURS AS NEEDED 60 tablet 2   Cholecalciferol (VITAMIN D ) 2000 units CAPS Take by mouth.     ELDERBERRY PO Take by mouth.     NON FORMULARY      OVER THE COUNTER MEDICATION CBD gummies     pravastatin  (PRAVACHOL ) 40 MG tablet Take 1 tablet (40 mg total) by mouth daily. Annual appt due in Dec must see provider for future refills 90 tablet 3   Probiotic Product ([employer] ADV PROBIOTIC GUMMIES PO) Take by mouth daily.     SYNTHROID  75 MCG tablet Take 1 tablet (75 mcg total) by mouth daily before breakfast. 90 tablet 3   No current facility-administered medications on file prior to visit.    Review of Systems     Objective:   Vitals:   05/25/23 0913  BP: 134/80  Pulse: (!) 59  Temp: 98.3 F (36.8 C)  SpO2: 99%   BP Readings from Last 3 Encounters:  05/25/23 134/80  01/08/23 126/74  01/04/22 124/72   Wt Readings from  Last 3 Encounters:  01/08/23 138 lb (62.6 kg)  12/18/22 137 lb (62.1 kg)  01/04/22 140 lb (63.5 kg)   Body mass index is 22.96 kg/m.    Physical Exam Constitutional:      Appearance: Normal appearance.  Musculoskeletal:        General: Tenderness (Tenderness with palpation left groin, slight tenderness to the lateral left hip) present. No swelling or deformity. Normal range of motion.     Right lower leg: No edema.     Left lower leg: No edema.     Comments: Increased pain with hip flexion, inversion eversion  Neurological:     Mental Status: She is alert.     Sensory: No sensory deficit.     Motor: No weakness.     Gait: Gait abnormal.            Assessment & Plan:    See Problem List for Assessment and Plan of chronic medical problems.

## 2023-05-25 ENCOUNTER — Ambulatory Visit (INDEPENDENT_AMBULATORY_CARE_PROVIDER_SITE_OTHER): Admitting: Internal Medicine

## 2023-05-25 ENCOUNTER — Ambulatory Visit (INDEPENDENT_AMBULATORY_CARE_PROVIDER_SITE_OTHER)

## 2023-05-25 ENCOUNTER — Encounter: Payer: Self-pay | Admitting: Internal Medicine

## 2023-05-25 VITALS — BP 134/80 | HR 59 | Temp 98.3°F | Ht 65.0 in

## 2023-05-25 DIAGNOSIS — M25552 Pain in left hip: Secondary | ICD-10-CM | POA: Diagnosis not present

## 2023-05-25 DIAGNOSIS — I700 Atherosclerosis of aorta: Secondary | ICD-10-CM | POA: Diagnosis not present

## 2023-05-25 DIAGNOSIS — R1032 Left lower quadrant pain: Secondary | ICD-10-CM | POA: Insufficient documentation

## 2023-05-25 DIAGNOSIS — M79605 Pain in left leg: Secondary | ICD-10-CM | POA: Diagnosis not present

## 2023-05-25 DIAGNOSIS — M545 Low back pain, unspecified: Secondary | ICD-10-CM | POA: Diagnosis not present

## 2023-05-25 DIAGNOSIS — M47816 Spondylosis without myelopathy or radiculopathy, lumbar region: Secondary | ICD-10-CM | POA: Diagnosis not present

## 2023-05-25 DIAGNOSIS — M4316 Spondylolisthesis, lumbar region: Secondary | ICD-10-CM | POA: Diagnosis not present

## 2023-05-25 MED ORDER — GABAPENTIN 100 MG PO CAPS
ORAL_CAPSULE | ORAL | 1 refills | Status: DC
Start: 2023-05-25 — End: 2023-11-13

## 2023-05-25 MED ORDER — TRIAMCINOLONE ACETONIDE 0.1 % EX CREA: 1.0000 | TOPICAL_CREAM | Freq: Two times a day (BID) | CUTANEOUS | 0 refills | Status: AC

## 2023-05-25 NOTE — Assessment & Plan Note (Signed)
 Acute Started in January without obvious cause Has pain in her left groin, wrapping around to the posterior leg down the posterior left leg No back pain, no real buttock pain Difficulty going up and down stairs, walking and applying pressure on the leg ?  Hip osteoarthritis, radiculopathy, labrum injury X-rays today Gabapentin  100 mg in the morning, 200-300 mg at bedtime.  She has taken this before-reviewed possible drowsiness as a side effect She will continue ibuprofen-taking only when needed-advised to take with food Continue Tylenol  as needed Referral to orthopedics

## 2023-05-25 NOTE — Patient Instructions (Addendum)
     Have xrays downstairs.   Medications changes include :   gabapentin  100 mg in morning and 200-300 mg at bedtime.   Triamcinolone cream for the rash    A referral was ordered [facility] and someone will call you to schedule an appointment.

## 2023-05-28 ENCOUNTER — Encounter: Payer: Self-pay | Admitting: Internal Medicine

## 2023-05-28 MED ORDER — HYDROCODONE-ACETAMINOPHEN 5-325 MG PO TABS: 1.0000 | ORAL_TABLET | Freq: Three times a day (TID) | ORAL | 0 refills | Status: DC | PRN

## 2023-06-05 DIAGNOSIS — M7062 Trochanteric bursitis, left hip: Secondary | ICD-10-CM | POA: Diagnosis not present

## 2023-06-07 DIAGNOSIS — M7061 Trochanteric bursitis, right hip: Secondary | ICD-10-CM | POA: Diagnosis not present

## 2023-06-19 DIAGNOSIS — M7061 Trochanteric bursitis, right hip: Secondary | ICD-10-CM | POA: Diagnosis not present

## 2023-06-21 DIAGNOSIS — M7061 Trochanteric bursitis, right hip: Secondary | ICD-10-CM | POA: Diagnosis not present

## 2023-06-26 DIAGNOSIS — M7061 Trochanteric bursitis, right hip: Secondary | ICD-10-CM | POA: Diagnosis not present

## 2023-11-13 ENCOUNTER — Encounter: Payer: Self-pay | Admitting: Internal Medicine

## 2023-11-13 ENCOUNTER — Other Ambulatory Visit: Payer: Self-pay

## 2023-11-13 MED ORDER — GABAPENTIN 100 MG PO CAPS: ORAL_CAPSULE | ORAL | 1 refills | Status: DC

## 2024-01-08 NOTE — Patient Instructions (Addendum)
 Blood work was ordered.      An EKG was done     Medications changes include :   None     Return in about 1 year (around 01/08/2025).    Health Maintenance, Female Adopting a healthy lifestyle and getting preventive care are important in promoting health and wellness. Ask your health care provider about: The right schedule for you to have regular tests and exams. Things you can do on your own to prevent diseases and keep yourself healthy. What should I know about diet, weight, and exercise? Eat a healthy diet  Eat a diet that includes plenty of vegetables, fruits, low-fat dairy products, and lean protein. Do not eat a lot of foods that are high in solid fats, added sugars, or sodium. Maintain a healthy weight Body mass index (BMI) is used to identify weight problems. It estimates body fat based on height and weight. Your health care provider can help determine your BMI and help you achieve or maintain a healthy weight. Get regular exercise Get regular exercise. This is one of the most important things you can do for your health. Most adults should: Exercise for at least 150 minutes each week. The exercise should increase your heart rate and make you sweat (moderate-intensity exercise). Do strengthening exercises at least twice a week. This is in addition to the moderate-intensity exercise. Spend less time sitting. Even light physical activity can be beneficial. Watch cholesterol and blood lipids Have your blood tested for lipids and cholesterol at 78 years of age, then have this test every 5 years. Have your cholesterol levels checked more often if: Your lipid or cholesterol levels are high. You are older than 78 years of age. You are at high risk for heart disease. What should I know about cancer screening? Depending on your health history and family history, you may need to have cancer screening at various ages. This may include screening for: Breast  cancer. Cervical cancer. Colorectal cancer. Skin cancer. Lung cancer. What should I know about heart disease, diabetes, and high blood pressure? Blood pressure and heart disease High blood pressure causes heart disease and increases the risk of stroke. This is more likely to develop in people who have high blood pressure readings or are overweight. Have your blood pressure checked: Every 3-5 years if you are 18-24 years of age. Every year if you are 58 years old or older. Diabetes Have regular diabetes screenings. This checks your fasting blood sugar level. Have the screening done: Once every three years after age 61 if you are at a normal weight and have a low risk for diabetes. More often and at a younger age if you are overweight or have a high risk for diabetes. What should I know about preventing infection? Hepatitis B If you have a higher risk for hepatitis B, you should be screened for this virus. Talk with your health care provider to find out if you are at risk for hepatitis B infection. Hepatitis C Testing is recommended for: Everyone born from [birth year] through 1965. Anyone with known risk factors for hepatitis C. Sexually transmitted infections (STIs) Get screened for STIs, including gonorrhea and chlamydia, if: You are sexually active and are younger than 78 years of age. You are older than 78 years of age and your health care provider tells you that you are at risk for this type of infection. Your sexual activity has changed since you were last screened, and you are at increased  risk for chlamydia or gonorrhea. Ask your health care provider if you are at risk. Ask your health care provider about whether you are at high risk for HIV. Your health care provider may recommend a prescription medicine to help prevent HIV infection. If you choose to take medicine to prevent HIV, you should first get tested for HIV. You should then be tested every 3 months for as long as you are taking  the medicine. Pregnancy If you are about to stop having your period (premenopausal) and you may become pregnant, seek counseling before you get pregnant. Take 400 to 800 micrograms (mcg) of folic acid every day if you become pregnant. Ask for birth control (contraception) if you want to prevent pregnancy. Osteoporosis and menopause Osteoporosis is a disease in which the bones lose minerals and strength with aging. This can result in bone fractures. If you are 2 years old or older, or if you are at risk for osteoporosis and fractures, ask your health care provider if you should: Be screened for bone loss. Take a calcium or vitamin D  supplement to lower your risk of fractures. Be given hormone replacement therapy (HRT) to treat symptoms of menopause. Follow these instructions at home: Alcohol use Do not drink alcohol if: Your health care provider tells you not to drink. You are pregnant, may be pregnant, or are planning to become pregnant. If you drink alcohol: Limit how much you have to: 0-1 drink a day. Know how much alcohol is in your drink. In the [place] one drink equals one 12 oz bottle of beer (355 mL), one 5 oz glass of wine (148 mL), or one 1 oz glass of hard liquor (44 mL). Lifestyle Do not use any products that contain nicotine or tobacco. These products include cigarettes, chewing tobacco, and vaping devices, such as e-cigarettes. If you need help quitting, ask your health care provider. Do not use street drugs. Do not share needles. Ask your health care provider for help if you need support or information about quitting drugs. General instructions Schedule regular health, dental, and eye exams. Stay current with your vaccines. Tell your health care provider if: You often feel depressed. You have ever been abused or do not feel safe at home. Summary Adopting a healthy lifestyle and getting preventive care are important in promoting health and wellness. Follow your health  care provider's instructions about healthy diet, exercising, and getting tested or screened for diseases. Follow your health care provider's instructions on monitoring your cholesterol and blood pressure. This information is not intended to replace advice given to you by your health care provider. Make sure you discuss any questions you have with your health care provider. Document Revised: 05/31/2020 Document Reviewed: 05/31/2020 Elsevier Patient Education  2024 [employer].

## 2024-01-08 NOTE — Progress Notes (Unsigned)
 Subjective:    Patient ID: Amy Tucker, female    DOB: [birth year], 78 y.o.   MRN: [id number]      HPI Amy Tucker is here for a Physical exam and her chronic medical problems.    Still having hip and back pain.  Has to rest after a while with certain activities.  Still doing all the housework and yard work.  Taking gabapentin  only as needed.    Medications and allergies reviewed with patient and updated if appropriate.  Medications Ordered Prior to Encounter[1]  Review of Systems  Constitutional:  Negative for fever.  HENT:  Negative for trouble swallowing.   Eyes:  Negative for visual disturbance.  Respiratory:  Negative for cough, shortness of breath and wheezing.   Cardiovascular:  Positive for palpitations (occ). Negative for chest pain and leg swelling.  Gastrointestinal:  Negative for abdominal pain, blood in stool, constipation and diarrhea.       Occ gerd  Genitourinary:  Negative for dysuria.  Musculoskeletal:  Positive for arthralgias (hip, knees) and back pain.  Skin:  Negative for rash.  Neurological:  Negative for light-headedness and headaches (occ - weather).  Psychiatric/Behavioral:  Negative for dysphoric mood. The patient is nervous/anxious.        Objective:   Vitals:   01/09/24 0821  BP: 130/80  Pulse: 70  Temp: 98.1 F (36.7 C)  SpO2: 97%   Filed Weights   01/09/24 0821  Weight: 133 lb (60.3 kg)   Body mass index is 22.13 kg/m.  BP Readings from Last 3 Encounters:  01/09/24 130/80  05/25/23 134/80  01/08/23 126/74    Wt Readings from Last 3 Encounters:  01/09/24 133 lb (60.3 kg)  01/08/23 138 lb (62.6 kg)  12/18/22 137 lb (62.1 kg)       Physical Exam Constitutional: She appears well-developed and well-nourished. No distress.  HENT:  Head: Normocephalic and atraumatic.  Right Ear: External ear normal. Normal ear canal and TM Left Ear: External ear normal.  Normal ear canal and TM Mouth/Throat: Oropharynx is clear and moist.   Eyes: Conjunctivae normal.  Neck: Neck supple. No tracheal deviation present. No thyromegaly present.  No carotid bruit  Cardiovascular: Normal rate, regular rhythm and normal heart sounds.   No murmur heard.  No edema. Pulmonary/Chest: Effort normal and breath sounds normal. No respiratory distress. She has no wheezes. She has no rales.  Breast: deferred   Abdominal: Soft. She exhibits no distension. There is no tenderness.  Lymphadenopathy: She has no cervical adenopathy.  Skin: Skin is warm and dry. She is not diaphoretic.  Psychiatric: She has a normal mood and affect. Her behavior is normal.     Lab Results  Component Value Date   WBC 4.2 01/08/2023   HGB 14.5 01/08/2023   HCT 43.1 01/08/2023   PLT 247.0 01/08/2023   GLUCOSE 89 01/08/2023   CHOL 186 01/08/2023   TRIG 170.0 (H) 01/08/2023   HDL 45.20 01/08/2023   LDLDIRECT 125.0 11/09/2014   LDLCALC 107 (H) 01/08/2023   ALT 12 01/08/2023   AST 16 01/08/2023   NA 140 01/08/2023   K 4.4 01/08/2023   CL 103 01/08/2023   CREATININE 0.77 01/08/2023   BUN 7 01/08/2023   CO2 30 01/08/2023   TSH 0.31 (L) 01/08/2023   HGBA1C 5.9 01/08/2023    EKG: Sinus bradycardia at 55 bpm, possible LAE, otherwise normal EKG.  Compared to EKG from 06/2012 sinus bradycardia and possible LAE are new.  No concerning findings and no further evaluation necessary.     Assessment & Plan:   Physical exam: Screening blood work  ordered Exercise  yard work, house work [employer] normal Substance abuse  none   Reviewed recommended immunizations.   Health Maintenance  Topic Date Due   Medicare Annual Wellness (AWV)  12/18/2023   COVID-19 Vaccine (3 - 2025-26 season) 01/24/2024 (Originally 09/24/2023)   Influenza Vaccine  04/22/2024 (Originally 08/24/2023)   DTaP/Tdap/Td (3 - Tdap) 01/08/2025 (Originally 06/22/2023)   Pneumococcal Vaccine: 50+ Years (1 of 1 - PCV) 01/08/2025 (Originally 03/02/1995)   Bone Density Scan  02/20/2025   Colonoscopy   05/25/2025   Hepatitis C Screening  Completed   Meningococcal B Vaccine  Aged Out   Mammogram  Discontinued   Zoster Vaccines- Shingrix  Discontinued        See Problem List for Assessment and Plan of chronic medical problems.        [1]  Current Outpatient Medications on File Prior to Visit  Medication Sig Dispense Refill   Cholecalciferol (VITAMIN D ) 2000 units CAPS Take by mouth.     ELDERBERRY PO Take by mouth.     NON FORMULARY      OVER THE COUNTER MEDICATION CBD gummies     Probiotic Product ([employer] ADV PROBIOTIC GUMMIES PO) Take by mouth daily.     triamcinolone  cream (KENALOG ) 0.1 % Apply 1 Application topically 2 (two) times daily. 30 g 0   No current facility-administered medications on file prior to visit.

## 2024-01-09 ENCOUNTER — Ambulatory Visit: Payer: Medicare HMO | Admitting: Internal Medicine

## 2024-01-09 VITALS — BP 130/80 | HR 70 | Temp 98.1°F | Ht 65.0 in | Wt 133.0 lb

## 2024-01-09 DIAGNOSIS — F419 Anxiety disorder, unspecified: Secondary | ICD-10-CM

## 2024-01-09 DIAGNOSIS — E7800 Pure hypercholesterolemia, unspecified: Secondary | ICD-10-CM | POA: Diagnosis not present

## 2024-01-09 DIAGNOSIS — E559 Vitamin D deficiency, unspecified: Secondary | ICD-10-CM | POA: Diagnosis not present

## 2024-01-09 DIAGNOSIS — Z636 Dependent relative needing care at home: Secondary | ICD-10-CM | POA: Diagnosis not present

## 2024-01-09 DIAGNOSIS — R7303 Prediabetes: Secondary | ICD-10-CM

## 2024-01-09 DIAGNOSIS — M79604 Pain in right leg: Secondary | ICD-10-CM

## 2024-01-09 DIAGNOSIS — E039 Hypothyroidism, unspecified: Secondary | ICD-10-CM

## 2024-01-09 DIAGNOSIS — M85851 Other specified disorders of bone density and structure, right thigh: Secondary | ICD-10-CM | POA: Diagnosis not present

## 2024-01-09 DIAGNOSIS — M545 Low back pain, unspecified: Secondary | ICD-10-CM

## 2024-01-09 DIAGNOSIS — Z0000 Encounter for general adult medical examination without abnormal findings: Secondary | ICD-10-CM

## 2024-01-09 LAB — CBC
HCT: 41.9 % (ref 36.0–46.0)
Hemoglobin: 14 g/dL (ref 12.0–15.0)
MCHC: 33.5 g/dL (ref 30.0–36.0)
MCV: 90.1 fl (ref 78.0–100.0)
Platelets: 253 K/uL (ref 150.0–400.0)
RBC: 4.65 Mil/uL (ref 3.87–5.11)
RDW: 13.7 % (ref 11.5–15.5)
WBC: 4.8 K/uL (ref 4.0–10.5)

## 2024-01-09 LAB — COMPREHENSIVE METABOLIC PANEL WITH GFR
ALT: 10 U/L (ref 3–35)
AST: 17 U/L (ref 5–37)
Albumin: 4.2 g/dL (ref 3.5–5.2)
Alkaline Phosphatase: 77 U/L (ref 39–117)
BUN: 8 mg/dL (ref 6–23)
CO2: 32 meq/L (ref 19–32)
Calcium: 9.4 mg/dL (ref 8.4–10.5)
Chloride: 101 meq/L (ref 96–112)
Creatinine, Ser: 0.75 mg/dL (ref 0.40–1.20)
GFR: 76.02 mL/min (ref >60.00)
Glucose, Bld: 101 mg/dL — ABNORMAL HIGH (ref 70–99)
Potassium: 4.2 meq/L (ref 3.5–5.1)
Sodium: 139 meq/L (ref 135–145)
Total Bilirubin: 0.6 mg/dL (ref 0.2–1.2)
Total Protein: 6.7 g/dL (ref 6.0–8.3)

## 2024-01-09 LAB — LIPID PANEL
Cholesterol: 193 mg/dL (ref 28–200)
HDL: 48.5 mg/dL (ref >39.00)
LDL Cholesterol: 113 mg/dL — ABNORMAL HIGH (ref 10–99)
NonHDL: 144.59
Total CHOL/HDL Ratio: 4
Triglycerides: 158 mg/dL — ABNORMAL HIGH (ref 10.0–149.0)
VLDL: 31.6 mg/dL (ref 0.0–40.0)

## 2024-01-09 LAB — TSH: TSH: 0.14 u[IU]/mL — ABNORMAL LOW (ref 0.35–5.50)

## 2024-01-09 LAB — HEMOGLOBIN A1C: Hgb A1c MFr Bld: 5.6 % (ref 4.6–6.5)

## 2024-01-09 LAB — VITAMIN D 25 HYDROXY (VIT D DEFICIENCY, FRACTURES): VITD: 47.42 ng/mL (ref 30.00–100.00)

## 2024-01-09 MED ORDER — GABAPENTIN 100 MG PO CAPS: ORAL_CAPSULE | ORAL | 1 refills | Status: AC

## 2024-01-09 MED ORDER — PRAVASTATIN SODIUM 40 MG PO TABS: 40.0000 mg | ORAL_TABLET | Freq: Every day | ORAL | 3 refills | Status: AC

## 2024-01-09 MED ORDER — SYNTHROID 75 MCG PO TABS: 75.0000 ug | ORAL_TABLET | Freq: Every day | ORAL | 3 refills | Status: DC

## 2024-01-09 MED ORDER — ALPRAZOLAM 0.25 MG PO TABS: ORAL_TABLET | ORAL | 2 refills | Status: AC

## 2024-01-09 NOTE — Assessment & Plan Note (Signed)
 Chronic Taking vitamin D daily Check vitamin D level

## 2024-01-09 NOTE — Assessment & Plan Note (Addendum)
 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

## 2024-01-09 NOTE — Assessment & Plan Note (Addendum)
 Chronic Regular exercise and healthy diet encouraged Check lipid panel, cmp, tsh, cbc Continue pravastatin  40 mg daily EKG today

## 2024-01-09 NOTE — Assessment & Plan Note (Addendum)
 Chronic intermittent lower back pain If standing long periods or doing certain activities will get increased back pain and right leg pain Continue gabapentin  as needed-100 mg in the morning, 200-300 mg at bedtime urine

## 2024-01-09 NOTE — Assessment & Plan Note (Signed)
 Chronic Lab Results  Component Value Date   HGBA1C 5.9 01/08/2023   Check a1c Low sugar / carb diet Stressed regular exercise

## 2024-01-09 NOTE — Assessment & Plan Note (Signed)
 Chronic  Clinically euthyroid Check tsh and will titrate med dose if needed Continue Synthroid  75 mcg daily

## 2024-01-09 NOTE — Assessment & Plan Note (Signed)
 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

## 2024-01-09 NOTE — Assessment & Plan Note (Signed)
 Chronic DEXA up-to-date-osteopenia with high FRAX Compared to 2 years prior no significant change Continue calcium and vitamin D  daily Continue regular exercise-walking Check vitamin d  level

## 2024-01-13 ENCOUNTER — Ambulatory Visit: Payer: Self-pay | Admitting: Internal Medicine

## 2024-01-13 DIAGNOSIS — E039 Hypothyroidism, unspecified: Secondary | ICD-10-CM

## 2024-01-13 MED ORDER — SYNTHROID 75 MCG PO TABS: 75.0000 ug | ORAL_TABLET | Freq: Every day | ORAL | Status: AC

## 2025-01-13 ENCOUNTER — Encounter: Admitting: Internal Medicine
